# Patient Record
Sex: Female | Born: 1935
Health system: Southern US, Community
[De-identification: ages and names within clinical notes are randomized; demographics above are authoritative.]

## PROBLEM LIST (undated history)

## (undated) DIAGNOSIS — I509 Heart failure, unspecified: Secondary | ICD-10-CM

## (undated) DIAGNOSIS — I2510 Atherosclerotic heart disease of native coronary artery without angina pectoris: Secondary | ICD-10-CM

## (undated) DIAGNOSIS — R0602 Shortness of breath: Secondary | ICD-10-CM

## (undated) DIAGNOSIS — I10 Essential (primary) hypertension: Secondary | ICD-10-CM

## (undated) DIAGNOSIS — M1990 Unspecified osteoarthritis, unspecified site: Secondary | ICD-10-CM

## (undated) DIAGNOSIS — M810 Age-related osteoporosis without current pathological fracture: Secondary | ICD-10-CM

## (undated) DIAGNOSIS — M109 Gout, unspecified: Secondary | ICD-10-CM

## (undated) DIAGNOSIS — E079 Disorder of thyroid, unspecified: Secondary | ICD-10-CM

## (undated) DIAGNOSIS — E039 Hypothyroidism, unspecified: Secondary | ICD-10-CM

## (undated) HISTORY — PX: ABDOMINAL HYSTERECTOMY: SHX81

## (undated) HISTORY — PX: APPENDECTOMY: SHX54

## (undated) HISTORY — PX: CARPAL TUNNEL RELEASE: SHX101

## (undated) HISTORY — PX: TONSILLECTOMY: SUR1361

---

## 1998-01-10 ENCOUNTER — Other Ambulatory Visit: Admission: RE | Admit: 1998-01-10 | Discharge: 1998-01-10 | Payer: Self-pay | Admitting: Internal Medicine

## 1998-07-11 ENCOUNTER — Emergency Department (HOSPITAL_COMMUNITY): Admission: EM | Admit: 1998-07-11 | Discharge: 1998-07-11 | Payer: Self-pay | Admitting: Emergency Medicine

## 1998-07-11 ENCOUNTER — Encounter: Payer: Self-pay | Admitting: Emergency Medicine

## 1999-10-04 ENCOUNTER — Encounter: Payer: Self-pay | Admitting: Emergency Medicine

## 1999-10-04 ENCOUNTER — Emergency Department (HOSPITAL_COMMUNITY): Admission: EM | Admit: 1999-10-04 | Discharge: 1999-10-04 | Payer: Self-pay | Admitting: Emergency Medicine

## 2000-12-07 ENCOUNTER — Encounter: Payer: Self-pay | Admitting: Emergency Medicine

## 2000-12-07 ENCOUNTER — Emergency Department (HOSPITAL_COMMUNITY): Admission: EM | Admit: 2000-12-07 | Discharge: 2000-12-07 | Payer: Self-pay | Admitting: Emergency Medicine

## 2000-12-10 ENCOUNTER — Emergency Department (HOSPITAL_COMMUNITY): Admission: EM | Admit: 2000-12-10 | Discharge: 2000-12-10 | Payer: Self-pay | Admitting: Emergency Medicine

## 2000-12-10 ENCOUNTER — Encounter: Payer: Self-pay | Admitting: Emergency Medicine

## 2001-01-16 ENCOUNTER — Encounter: Payer: Self-pay | Admitting: Internal Medicine

## 2001-01-16 ENCOUNTER — Ambulatory Visit (HOSPITAL_COMMUNITY): Admission: RE | Admit: 2001-01-16 | Discharge: 2001-01-16 | Payer: Self-pay | Admitting: Internal Medicine

## 2001-02-03 ENCOUNTER — Ambulatory Visit (HOSPITAL_COMMUNITY): Admission: RE | Admit: 2001-02-03 | Discharge: 2001-02-03 | Payer: Self-pay | Admitting: Internal Medicine

## 2001-02-03 ENCOUNTER — Encounter: Payer: Self-pay | Admitting: Internal Medicine

## 2001-11-28 ENCOUNTER — Emergency Department (HOSPITAL_COMMUNITY): Admission: EM | Admit: 2001-11-28 | Discharge: 2001-11-28 | Payer: Self-pay | Admitting: *Deleted

## 2002-02-02 ENCOUNTER — Emergency Department (HOSPITAL_COMMUNITY): Admission: EM | Admit: 2002-02-02 | Discharge: 2002-02-02 | Payer: Self-pay | Admitting: Emergency Medicine

## 2002-02-02 ENCOUNTER — Encounter: Payer: Self-pay | Admitting: Emergency Medicine

## 2002-06-06 ENCOUNTER — Encounter: Payer: Self-pay | Admitting: *Deleted

## 2002-06-06 ENCOUNTER — Inpatient Hospital Stay (HOSPITAL_COMMUNITY): Admission: EM | Admit: 2002-06-06 | Discharge: 2002-06-08 | Payer: Self-pay | Admitting: *Deleted

## 2002-06-08 ENCOUNTER — Emergency Department (HOSPITAL_COMMUNITY): Admission: EM | Admit: 2002-06-08 | Discharge: 2002-06-09 | Payer: Self-pay | Admitting: Emergency Medicine

## 2002-06-14 ENCOUNTER — Emergency Department (HOSPITAL_COMMUNITY): Admission: EM | Admit: 2002-06-14 | Discharge: 2002-06-14 | Payer: Self-pay | Admitting: *Deleted

## 2004-01-09 ENCOUNTER — Encounter: Admission: RE | Admit: 2004-01-09 | Discharge: 2004-01-09 | Payer: Self-pay | Admitting: Cardiology

## 2004-02-14 ENCOUNTER — Ambulatory Visit (HOSPITAL_COMMUNITY): Admission: RE | Admit: 2004-02-14 | Discharge: 2004-02-14 | Payer: Self-pay | Admitting: Gastroenterology

## 2004-09-29 ENCOUNTER — Inpatient Hospital Stay (HOSPITAL_COMMUNITY): Admission: EM | Admit: 2004-09-29 | Discharge: 2004-10-02 | Payer: Self-pay | Admitting: *Deleted

## 2004-12-22 ENCOUNTER — Encounter: Admission: RE | Admit: 2004-12-22 | Discharge: 2004-12-22 | Payer: Self-pay | Admitting: Cardiology

## 2005-02-17 ENCOUNTER — Emergency Department (HOSPITAL_COMMUNITY): Admission: EM | Admit: 2005-02-17 | Discharge: 2005-02-17 | Payer: Self-pay | Admitting: *Deleted

## 2005-08-08 ENCOUNTER — Encounter: Admission: RE | Admit: 2005-08-08 | Discharge: 2005-08-08 | Payer: Self-pay | Admitting: Cardiology

## 2006-10-08 ENCOUNTER — Inpatient Hospital Stay (HOSPITAL_COMMUNITY): Admission: EM | Admit: 2006-10-08 | Discharge: 2006-10-10 | Payer: Self-pay | Admitting: Emergency Medicine

## 2006-10-09 ENCOUNTER — Encounter (INDEPENDENT_AMBULATORY_CARE_PROVIDER_SITE_OTHER): Payer: Self-pay | Admitting: Cardiovascular Disease

## 2007-09-22 ENCOUNTER — Ambulatory Visit: Payer: Self-pay | Admitting: Internal Medicine

## 2007-10-02 ENCOUNTER — Ambulatory Visit: Payer: Self-pay | Admitting: Internal Medicine

## 2007-10-29 ENCOUNTER — Emergency Department (HOSPITAL_COMMUNITY): Admission: EM | Admit: 2007-10-29 | Discharge: 2007-10-29 | Payer: Self-pay | Admitting: Emergency Medicine

## 2007-12-09 ENCOUNTER — Ambulatory Visit: Payer: Self-pay | Admitting: Internal Medicine

## 2008-02-06 ENCOUNTER — Emergency Department (HOSPITAL_COMMUNITY): Admission: EM | Admit: 2008-02-06 | Discharge: 2008-02-06 | Payer: Self-pay | Admitting: Emergency Medicine

## 2008-07-13 ENCOUNTER — Emergency Department (HOSPITAL_COMMUNITY): Admission: EM | Admit: 2008-07-13 | Discharge: 2008-07-13 | Payer: Self-pay | Admitting: Emergency Medicine

## 2008-07-16 ENCOUNTER — Emergency Department (HOSPITAL_COMMUNITY): Admission: EM | Admit: 2008-07-16 | Discharge: 2008-07-16 | Payer: Self-pay | Admitting: Emergency Medicine

## 2009-01-31 IMAGING — CR DG CHEST 2V
1 series · 1 of 1 positions shown · non-contrast
Comparison: 10/29/2007

CLINICAL DATA: Chest pain

CHEST - 2 VIEW

[view not recorded]
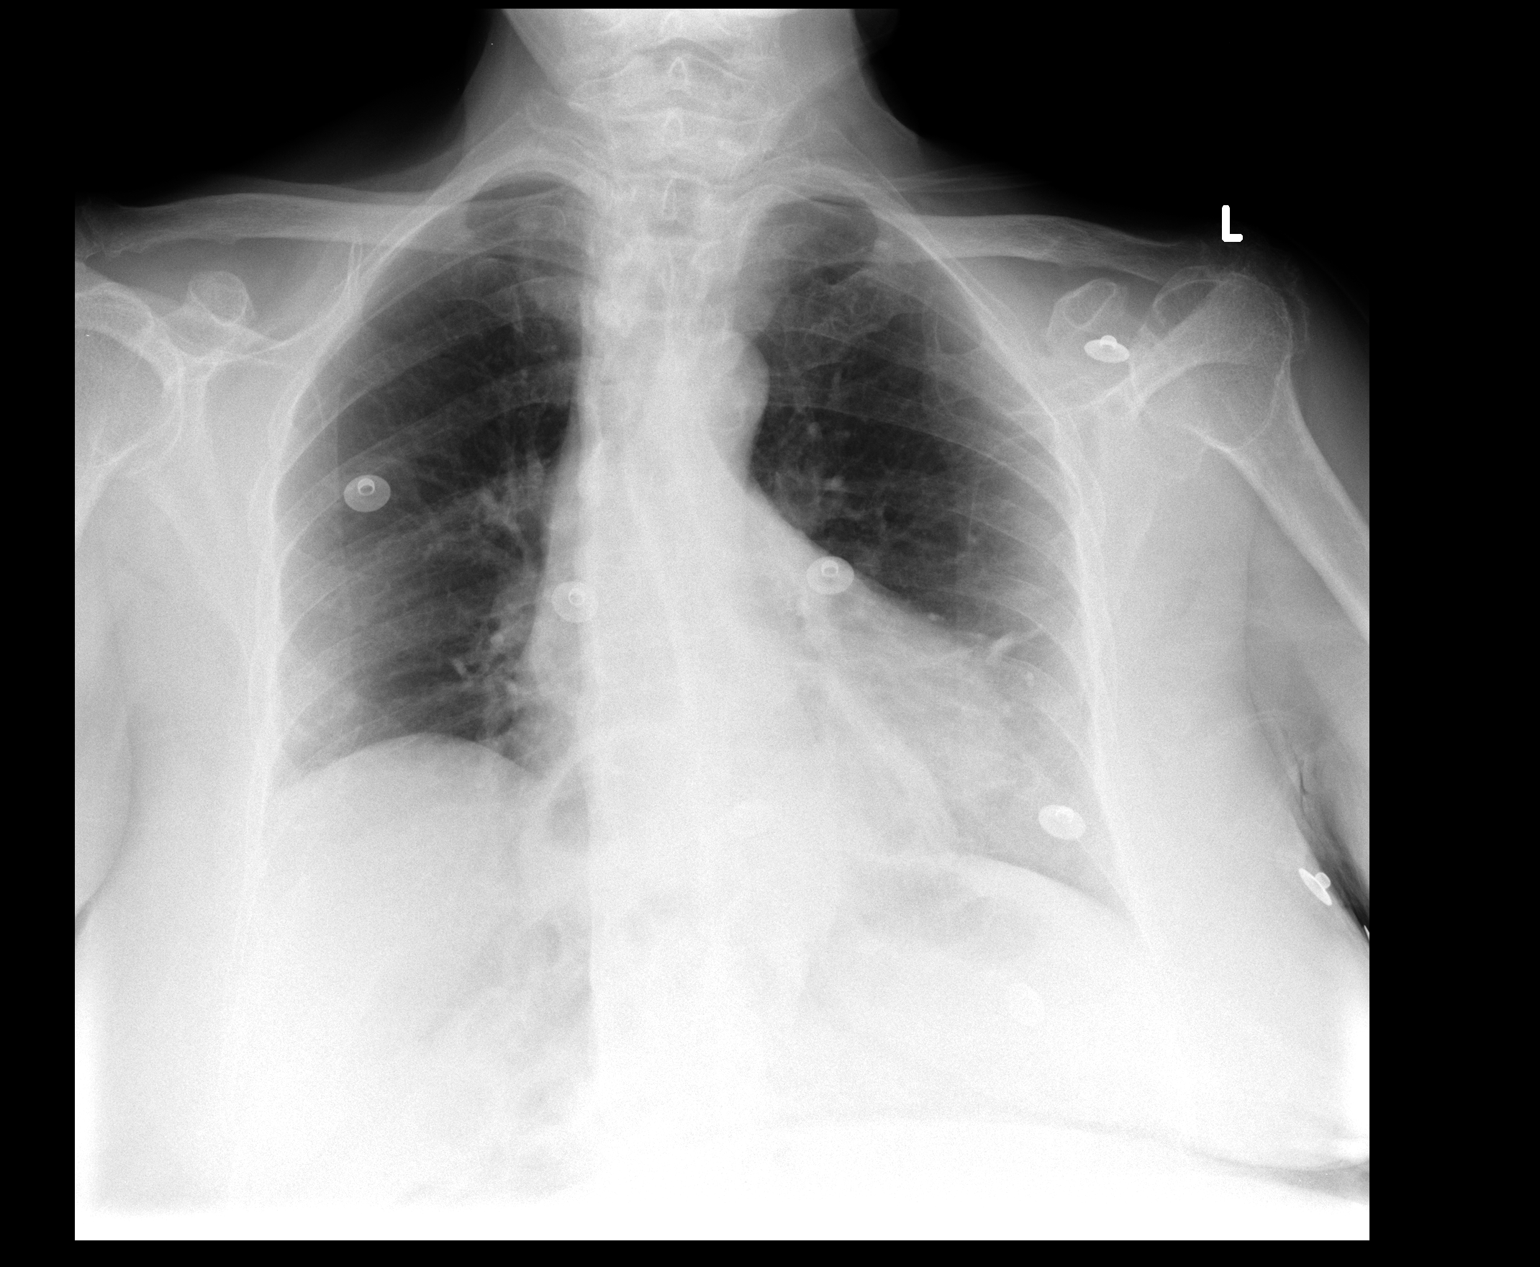

[1 of 1 positions shown; findings below may reference images not displayed]

FINDINGS: Moderate hiatal hernia is evident.  Heart size upper
limits normal.  Lungs are clear.  No effusion.
IMPRESSION: 1.  Moderate hiatal hernia.
2.  No acute disease

## 2009-03-28 ENCOUNTER — Emergency Department (HOSPITAL_COMMUNITY): Admission: EM | Admit: 2009-03-28 | Discharge: 2009-03-28 | Payer: Self-pay | Admitting: Family Medicine

## 2009-10-14 ENCOUNTER — Inpatient Hospital Stay (HOSPITAL_COMMUNITY): Admission: EM | Admit: 2009-10-14 | Discharge: 2009-10-19 | Payer: Self-pay | Admitting: Emergency Medicine

## 2009-11-23 ENCOUNTER — Encounter: Admission: RE | Admit: 2009-11-23 | Discharge: 2009-11-23 | Payer: Self-pay | Admitting: Cardiovascular Disease

## 2010-10-03 ENCOUNTER — Observation Stay (HOSPITAL_COMMUNITY)
Admission: AD | Admit: 2010-10-03 | Discharge: 2010-10-05 | Payer: Self-pay | Source: Home / Self Care | Attending: Cardiovascular Disease | Admitting: Cardiovascular Disease

## 2010-10-03 LAB — COMPREHENSIVE METABOLIC PANEL
ALT: 10 U/L (ref 0–35)
AST: 18 U/L (ref 0–37)
Albumin: 3.4 g/dL — ABNORMAL LOW (ref 3.5–5.2)
Alkaline Phosphatase: 60 U/L (ref 39–117)
BUN: 33 mg/dL — ABNORMAL HIGH (ref 6–23)
CO2: 29 mEq/L (ref 19–32)
Calcium: 9 mg/dL (ref 8.4–10.5)
Chloride: 103 mEq/L (ref 96–112)
Creatinine, Ser: 1.41 mg/dL — ABNORMAL HIGH (ref 0.4–1.2)
GFR calc Af Amer: 42 mL/min — ABNORMAL LOW (ref >60)
GFR calc non Af Amer: 35 mL/min — ABNORMAL LOW (ref >60)
Glucose, Bld: 108 mg/dL — ABNORMAL HIGH (ref 70–99)
Potassium: 5.2 mEq/L — ABNORMAL HIGH (ref 3.5–5.1)
Sodium: 138 mEq/L (ref 135–145)
Total Bilirubin: 0.4 mg/dL (ref 0.3–1.2)
Total Protein: 6.7 g/dL (ref 6.0–8.3)

## 2010-10-03 LAB — DIFFERENTIAL
Basophils Absolute: 0 10*3/uL (ref 0.0–0.1)
Basophils Relative: 0 % (ref 0–1)
Eosinophils Absolute: 0.1 10*3/uL (ref 0.0–0.7)
Eosinophils Relative: 2 % (ref 0–5)
Lymphocytes Relative: 40 % (ref 12–46)
Lymphs Abs: 1.2 10*3/uL (ref 0.7–4.0)
Monocytes Absolute: 0.4 10*3/uL (ref 0.1–1.0)
Monocytes Relative: 13 % — ABNORMAL HIGH (ref 3–12)
Neutro Abs: 1.4 10*3/uL — ABNORMAL LOW (ref 1.7–7.7)
Neutrophils Relative %: 46 % (ref 43–77)

## 2010-10-03 LAB — CBC
HCT: 38.3 % (ref 36.0–46.0)
Hemoglobin: 12.7 g/dL (ref 12.0–15.0)
MCH: 30.8 pg (ref 26.0–34.0)
MCHC: 33.2 g/dL (ref 30.0–36.0)
MCV: 93 fL (ref 78.0–100.0)
Platelets: 214 10*3/uL (ref 150–400)
RBC: 4.12 MIL/uL (ref 3.87–5.11)
RDW: 14.2 % (ref 11.5–15.5)
WBC: 3.1 10*3/uL — ABNORMAL LOW (ref 4.0–10.5)

## 2010-10-03 LAB — CARDIAC PANEL(CRET KIN+CKTOT+MB+TROPI)
CK, MB: 2.3 ng/mL (ref 0.3–4.0)
Relative Index: INVALID (ref 0.0–2.5)
Total CK: 64 U/L (ref 7–177)
Troponin I: 0.02 ng/mL (ref 0.00–0.06)

## 2010-10-04 LAB — URINALYSIS, ROUTINE W REFLEX MICROSCOPIC
Bilirubin Urine: NEGATIVE
Hemoglobin, Urine: NEGATIVE
Ketones, ur: NEGATIVE mg/dL
Nitrite: NEGATIVE
Protein, ur: NEGATIVE mg/dL
Specific Gravity, Urine: 1.008 (ref 1.005–1.030)
Urine Glucose, Fasting: NEGATIVE mg/dL
Urobilinogen, UA: 0.2 mg/dL (ref 0.0–1.0)
pH: 6 (ref 5.0–8.0)

## 2010-10-04 LAB — URINE MICROSCOPIC-ADD ON

## 2010-10-04 LAB — CARDIAC PANEL(CRET KIN+CKTOT+MB+TROPI)
CK, MB: 1.7 ng/mL (ref 0.3–4.0)
CK, MB: 1.8 ng/mL (ref 0.3–4.0)
Relative Index: INVALID (ref 0.0–2.5)
Relative Index: INVALID (ref 0.0–2.5)
Total CK: 48 U/L (ref 7–177)
Total CK: 53 U/L (ref 7–177)
Troponin I: 0.02 ng/mL (ref 0.00–0.06)
Troponin I: 0.02 ng/mL (ref 0.00–0.06)

## 2010-10-04 LAB — TSH: TSH: 2.985 u[IU]/mL (ref 0.350–4.500)

## 2010-10-05 LAB — BASIC METABOLIC PANEL
BUN: 25 mg/dL — ABNORMAL HIGH (ref 6–23)
CO2: 28 mEq/L (ref 19–32)
Calcium: 8.9 mg/dL (ref 8.4–10.5)
Chloride: 102 mEq/L (ref 96–112)
Creatinine, Ser: 1.19 mg/dL (ref 0.4–1.2)
GFR calc Af Amer: 51 mL/min — ABNORMAL LOW (ref >60)
GFR calc non Af Amer: 42 mL/min — ABNORMAL LOW (ref >60)
Glucose, Bld: 84 mg/dL (ref 70–99)
Potassium: 3.9 mEq/L (ref 3.5–5.1)
Sodium: 138 mEq/L (ref 135–145)

## 2010-10-21 ENCOUNTER — Observation Stay (HOSPITAL_COMMUNITY)
Admission: EM | Admit: 2010-10-21 | Discharge: 2010-10-23 | Payer: Self-pay | Source: Home / Self Care | Attending: Cardiovascular Disease | Admitting: Cardiovascular Disease

## 2010-10-23 LAB — DIFFERENTIAL
Basophils Absolute: 0 K/uL (ref 0.0–0.1)
Basophils Relative: 1 % (ref 0–1)
Eosinophils Absolute: 0.1 10*3/uL (ref 0.0–0.7)
Eosinophils Relative: 2 % (ref 0–5)
Lymphocytes Relative: 45 % (ref 12–46)
Lymphs Abs: 1.7 10*3/uL (ref 0.7–4.0)
Monocytes Absolute: 0.4 K/uL (ref 0.1–1.0)
Monocytes Relative: 10 % (ref 3–12)
Neutro Abs: 1.6 10*3/uL — ABNORMAL LOW (ref 1.7–7.7)
Neutrophils Relative %: 43 % (ref 43–77)

## 2010-10-23 LAB — URINALYSIS, ROUTINE W REFLEX MICROSCOPIC
Bilirubin Urine: NEGATIVE
Hgb urine dipstick: NEGATIVE
Ketones, ur: NEGATIVE mg/dL
Nitrite: NEGATIVE
Protein, ur: NEGATIVE mg/dL
Specific Gravity, Urine: 1.021 (ref 1.005–1.030)
Urine Glucose, Fasting: NEGATIVE mg/dL
Urobilinogen, UA: 0.2 mg/dL (ref 0.0–1.0)
pH: 5.5 (ref 5.0–8.0)

## 2010-10-23 LAB — CBC
HCT: 36.1 % (ref 36.0–46.0)
Hemoglobin: 11.9 g/dL — ABNORMAL LOW (ref 12.0–15.0)
MCH: 30.1 pg (ref 26.0–34.0)
MCHC: 33 g/dL (ref 30.0–36.0)
MCV: 91.2 fL (ref 78.0–100.0)
Platelets: 224 10*3/uL (ref 150–400)
RBC: 3.96 MIL/uL (ref 3.87–5.11)
RDW: 13.9 % (ref 11.5–15.5)
WBC: 3.7 10*3/uL — ABNORMAL LOW (ref 4.0–10.5)

## 2010-10-23 LAB — CARDIAC PANEL(CRET KIN+CKTOT+MB+TROPI)
CK, MB: 1.9 ng/mL (ref 0.3–4.0)
Relative Index: INVALID (ref 0.0–2.5)
Relative Index: INVALID (ref 0.0–2.5)
Total CK: 45 U/L (ref 7–177)
Troponin I: 0.01 ng/mL (ref 0.00–0.06)

## 2010-10-23 LAB — CK TOTAL AND CKMB (NOT AT ARMC): Relative Index: INVALID (ref 0.0–2.5)

## 2010-10-23 LAB — POCT CARDIAC MARKERS: Troponin i, poc: <0.05 ng/mL (ref 0.00–0.09)

## 2010-10-23 LAB — BASIC METABOLIC PANEL
BUN: 20 mg/dL (ref 6–23)
CO2: 25 mEq/L (ref 19–32)
Chloride: 103 mEq/L (ref 96–112)
GFR calc Af Amer: 60 mL/min — ABNORMAL LOW (ref >60)
Potassium: 4.4 mEq/L (ref 3.5–5.1)

## 2010-10-23 LAB — BASIC METABOLIC PANEL WITH GFR
Calcium: 9.3 mg/dL (ref 8.4–10.5)
Creatinine, Ser: 1.04 mg/dL (ref 0.4–1.2)
GFR calc non Af Amer: 50 mL/min — ABNORMAL LOW (ref >60)
Glucose, Bld: 87 mg/dL (ref 70–99)
Sodium: 137 meq/L (ref 135–145)

## 2010-10-23 LAB — PROTIME-INR
INR: 0.99 (ref 0.00–1.49)
Prothrombin Time: 13.3 seconds (ref 11.6–15.2)

## 2010-10-23 LAB — APTT: aPTT: 33 s (ref 24–37)

## 2010-10-23 LAB — BRAIN NATRIURETIC PEPTIDE: Pro B Natriuretic peptide (BNP): 88 pg/mL (ref 0.0–100.0)

## 2010-10-23 LAB — D-DIMER, QUANTITATIVE: D-Dimer, Quant: 1.05 ug/mL-FEU — ABNORMAL HIGH (ref 0.00–0.48)

## 2010-10-26 NOTE — Discharge Summary (Signed)
  NAMEMEGHEN, AKOPYAN              ACCOUNT NO.:  [id number]  MEDICAL RECORD NO.:  [id number]          PATIENT TYPE:  OBV  LOCATION:  3731                         FACILITY:  [facility]  PHYSICIAN:  Ricki Rodriguez, M.D.  DATE OF BIRTH:  Jul 25, 1916  DATE OF ADMISSION:  10/21/2010 DATE OF DISCHARGE:  10/23/2010                              DISCHARGE SUMMARY   The patient was admitted by Dr. Orpah Cobb.  FINAL DIAGNOSES: 1. Chest pain. 2. Hypertension. 3. Gout. 4. Hypothyroidism. 5. Large diaphragmatic hernia. 6. Osteoarthrosis.  DISCHARGE MEDICATIONS: 1. Rosuvastatin 5 mg half a tablet at bedtime. 2. Azor 5/20 mg half a tablet daily. 3. Aspirin 81 mg daily. 4. Fish oil 1000 mg daily. 5. Levoxyl 50 mcg one daily. 6. Nitroglycerin 0.4 mg once sublingual every 5 minutes as needed up     to three doses as needed. 7. Soriatane 25 mg one tablet once a week. 8. Triamcinolone 0.1%, Cetaphil lotion, one application topically     twice daily.  DISCHARGE DIET:  Low-sodium heart-healthy diet.  DISCHARGE ACTIVITY:  The patient is to increase activity slowly as tolerated. Followup by Dr. Orpah Cobb in 2 weeks.  The patient is to call [phone] for appointment.  HISTORY:  This is a 77 year old black female presented with substernal chest pain.  The patient has some episodes of palpitation also and because of her advanced age, the patient is treated medically only.  PHYSICAL EXAMINATION:  GENERAL:  The patient is alert and oriented x3, in no acute distress. VITAL SIGNS:  Temperature 97.3, pulse 69, respirations 18, blood pressure 110/56. HEENT:  The patient is normocephalic, atraumatic with brown eyes. Conjunctivae pink.  Sclerae nonicteric.  Throat revealed wet midline pink tongue. NECK:  No JVD and no carotid bruit. LUNGS:  Clear bilaterally. HEART:  Normal S1 and S2. ABDOMEN:  Soft and nontender. EXTREMITIES:  Trace ankle edema.  No cyanosis or clubbing. CENTRAL NERVOUS  SYSTEM:  Cranial nerves grossly intact.  Bilateral equal grips.  The patient is right handed. SKIN:  Warm and dry.  LABORATORY DATA:  Hemoglobin slightly low at 11.9, hematocrit 36.1, normal WBC count and platelet count.  Normal electrolytes, BUN, creatinine.  INR 0.99.  B natriuretic peptide normal at 88.  Troponin I and CK-MB normal x3.  Chest x-ray, no acute finding, moderate hiatal hernia. CT angiography of the chest did not reveal any acute pulmonary embolus and showed large hiatal hernia again, right hepatic cyst and left renal cyst.  HOSPITAL COURSE:  The patient was placed in observation, myocardial infarction was ruled out.  Her activity was increased, and on October 23, 2010, she was discharged home in satisfactory condition with followup by me in 2 weeks.     Ricki Rodriguez, M.D.     ASK/MEDQ  D:  10/23/2010  T:  10/24/2010  Job:  [id number]  Electronically Signed by Orpah Cobb M.D. on 10/24/2010 01:21:49 PM

## 2010-10-26 NOTE — H&P (Signed)
  NAMEYESLI, [name]              ACCOUNT NO.:  [id number]  MEDICAL RECORD NO.:  [id number]          PATIENT TYPE:  OBV  LOCATION:  3731                         FACILITY:  [facility]  PHYSICIAN:  Ricki Rodriguez, M.D.  DATE OF BIRTH:  [birth year]  DATE OF ADMISSION:  10/21/2010 DATE OF DISCHARGE:                             HISTORY & PHYSICAL   CHIEF COMPLAINT:  Chest pain.  HISTORY OF PRESENT ILLNESS:  This 77 year old black female with a chronic history of weakness and palpitation, had sudden onset of chest pain responding to nitroglycerin and oxygen.  Currently, the patient is chest pain free.  PAST MEDICAL HISTORY:  Positive for diabetes mellitus type 2 and hypertension for a long time and no history of smoking, alcohol intake, drug intake, or elevated cholesterol level.  PAST SURGICAL HISTORY:  Hysterectomy in 1940s, cataract surgery of the left side many years ago.  PERSONAL HISTORY:  The patient is widowed husband died in 03/09/62 from cerebral hemorrhage aged 76.  The patient has one daughter.  FAMILY HISTORY:  Mother died age 63 with some kind of cancer.  Father died of heart disease at age 90.  No brother, has 4 sisters who are all living.  REVIEW OF SYSTEMS:  The patient denies recent weight gain, weight loss, wears glasses, dentures.  No history of cough, no history of sinus drainage.  No headache or hemoptysis.  Occasional chest pain, palpitations, and dyspnea.  No history of asthma.  No GI bleed, hepatitis, blood transfusion.  Positive history of kidney stone.  No strokes, seizures, or psychiatric admissions.  PHYSICAL EXAMINATION:  CNS:  The patient is averagely built and nourished black female in no acute distress. HEENT:  The patient is normocephalic, atraumatic with brown eyes. Conjunctivae pink.  Sclerae nonicteric, throat pink.  Wet midline tongue. NECK:  No JVD, no carotid bruit. LUNGS:  Clear bilaterally. HEART:  Normal S1 and S2. ABDOMEN:  Soft and  nontender. EXTREMITIES:  Trace edema, no cyanosis, or clubbing. CNS:  Cranial nerves grossly intact.  Bilateral equal grips.  The patient is right handed. SKIN:  Warm and dry.  MEDICATIONS: 1. Azor 5/20 half a tablet daily 2. Colcrys 0.6 mg daily. 3. Crestor 5 mg half a tablet at bedtime. 4. Metoprolol tartrate 25 mg half twice daily. 5. Levoxyl 50 mcg daily. 6. Soriatane 25 mg every other day. 7. Aspirin 81 mg daily. 8. Lasix 20 mg once a week as needed. 9. Potassium 10 mEq one daily as needed. 10.Calcium 69 mg daily. 11.Nitroglycerin 0.4 mg one sublingual every 5 minutes x3 as needed     for chest pain.  ALLERGIES:  None.  IMPRESSION: 1. Chest pain. 2. Palpitation. 3. Weakness.  PLAN:  To place the patient in observation and rule out for MI and continue home medications.     Ricki Rodriguez, M.D.     ASK/MEDQ  D:  10/21/2010  T:  10/22/2010  Job:  [id number]  Electronically Signed by Orpah Cobb M.D. on 10/24/2010 01:20:52 PM

## 2010-12-16 LAB — COMPREHENSIVE METABOLIC PANEL
ALT: <8 U/L (ref 0–35)
AST: 20 U/L (ref 0–37)
Albumin: 3.5 g/dL (ref 3.5–5.2)
Alkaline Phosphatase: 79 U/L (ref 39–117)
Calcium: 8.9 mg/dL (ref 8.4–10.5)
GFR calc Af Amer: 53 mL/min — ABNORMAL LOW (ref >60)
Glucose, Bld: 131 mg/dL — ABNORMAL HIGH (ref 70–99)
Potassium: 3.8 mEq/L (ref 3.5–5.1)
Sodium: 139 mEq/L (ref 135–145)
Total Protein: 6.5 g/dL (ref 6.0–8.3)

## 2010-12-16 LAB — BASIC METABOLIC PANEL
BUN: 20 mg/dL (ref 6–23)
CO2: 29 mEq/L (ref 19–32)
Calcium: 8.9 mg/dL (ref 8.4–10.5)
Creatinine, Ser: 1.03 mg/dL (ref 0.4–1.2)
Glucose, Bld: 81 mg/dL (ref 70–99)

## 2010-12-16 LAB — DIFFERENTIAL
Basophils Absolute: 0 10*3/uL (ref 0.0–0.1)
Basophils Relative: 1 % (ref 0–1)
Eosinophils Absolute: 0.1 10*3/uL (ref 0.0–0.7)
Eosinophils Relative: 3 % (ref 0–5)
Monocytes Absolute: 0.2 10*3/uL (ref 0.1–1.0)
Monocytes Relative: 7 % (ref 3–12)
Neutro Abs: 1.4 10*3/uL — ABNORMAL LOW (ref 1.7–7.7)

## 2010-12-16 LAB — CBC
Hemoglobin: 12.5 g/dL (ref 12.0–15.0)
MCHC: 34.7 g/dL (ref 30.0–36.0)
Platelets: 179 10*3/uL (ref 150–400)
RDW: 14.4 % (ref 11.5–15.5)

## 2010-12-16 LAB — CARDIAC PANEL(CRET KIN+CKTOT+MB+TROPI)
CK, MB: 1.8 ng/mL (ref 0.3–4.0)
CK, MB: 2.3 ng/mL (ref 0.3–4.0)
Relative Index: INVALID (ref 0.0–2.5)
Relative Index: INVALID (ref 0.0–2.5)
Total CK: 53 U/L (ref 7–177)
Troponin I: 0.01 ng/mL (ref 0.00–0.06)

## 2010-12-16 LAB — TSH: TSH: 4.259 u[IU]/mL (ref 0.350–4.500)

## 2010-12-16 LAB — PROTIME-INR: Prothrombin Time: 13.4 seconds (ref 11.6–15.2)

## 2010-12-17 LAB — LIPID PANEL
HDL: 75 mg/dL (ref >39)
Total CHOL/HDL Ratio: 3.3 RATIO

## 2010-12-17 LAB — CBC
HCT: 33 % — ABNORMAL LOW (ref 36.0–46.0)
MCHC: 34.6 g/dL (ref 30.0–36.0)
MCV: 92.1 fL (ref 78.0–100.0)
RBC: 3.58 MIL/uL — ABNORMAL LOW (ref 3.87–5.11)
WBC: 3.3 10*3/uL — ABNORMAL LOW (ref 4.0–10.5)

## 2010-12-17 LAB — BASIC METABOLIC PANEL
BUN: 16 mg/dL (ref 6–23)
Calcium: 8.5 mg/dL (ref 8.4–10.5)
Chloride: 104 mEq/L (ref 96–112)
Creatinine, Ser: 1.09 mg/dL (ref 0.4–1.2)

## 2010-12-17 LAB — CARDIAC PANEL(CRET KIN+CKTOT+MB+TROPI): Troponin I: 0.02 ng/mL (ref 0.00–0.06)

## 2011-01-21 ENCOUNTER — Emergency Department (HOSPITAL_COMMUNITY): Payer: Medicare Other

## 2011-01-21 ENCOUNTER — Emergency Department (HOSPITAL_COMMUNITY)
Admission: EM | Admit: 2011-01-21 | Discharge: 2011-01-21 | Disposition: A | Discharge status: Home / Self Care | Payer: Medicare Other | Attending: Emergency Medicine | Admitting: Emergency Medicine

## 2011-01-21 DIAGNOSIS — K449 Diaphragmatic hernia without obstruction or gangrene: Secondary | ICD-10-CM | POA: Insufficient documentation

## 2011-01-21 DIAGNOSIS — R0789 Other chest pain: Secondary | ICD-10-CM | POA: Insufficient documentation

## 2011-01-21 DIAGNOSIS — E039 Hypothyroidism, unspecified: Secondary | ICD-10-CM | POA: Insufficient documentation

## 2011-01-21 DIAGNOSIS — I10 Essential (primary) hypertension: Secondary | ICD-10-CM | POA: Insufficient documentation

## 2011-01-21 DIAGNOSIS — I517 Cardiomegaly: Secondary | ICD-10-CM | POA: Insufficient documentation

## 2011-01-21 LAB — CBC
HCT: 38.9 % (ref 36.0–46.0)
MCHC: 34.2 g/dL (ref 30.0–36.0)
RDW: 13.1 % (ref 11.5–15.5)

## 2011-01-21 LAB — POCT CARDIAC MARKERS
CKMB, poc: <1 ng/mL — ABNORMAL LOW (ref 1.0–8.0)
Myoglobin, poc: 111 ng/mL (ref 12–200)

## 2011-01-21 LAB — BASIC METABOLIC PANEL
Calcium: 9.4 mg/dL (ref 8.4–10.5)
GFR calc Af Amer: 59 mL/min — ABNORMAL LOW (ref >60)
GFR calc non Af Amer: 48 mL/min — ABNORMAL LOW (ref >60)
Glucose, Bld: 85 mg/dL (ref 70–99)
Sodium: 139 mEq/L (ref 135–145)

## 2011-01-21 LAB — DIFFERENTIAL
Basophils Absolute: 0 10*3/uL (ref 0.0–0.1)
Basophils Relative: 1 % (ref 0–1)
Eosinophils Absolute: 0.1 10*3/uL (ref 0.0–0.7)
Eosinophils Relative: 2 % (ref 0–5)
Monocytes Absolute: 0.3 10*3/uL (ref 0.1–1.0)

## 2011-01-21 LAB — POCT I-STAT, CHEM 8
HCT: 41 % (ref 36.0–46.0)
Hemoglobin: 13.9 g/dL (ref 12.0–15.0)
Potassium: 4.7 mEq/L (ref 3.5–5.1)
Sodium: 138 mEq/L (ref 135–145)

## 2011-02-12 NOTE — Assessment & Plan Note (Signed)
[facility]                         ELECTROPHYSIOLOGY OFFICE NOTE   [name], [name]                     MRN:          [id number]  DATE:09/22/2007                            DOB:          [birth year]    HISTORY OF PRESENT ILLNESS:  [name] returns to have her follow up.  She was a very pleasant 77 year old woman with a history of dizzy spells  and near syncopal episodes who is referred today by Dr. Shana [name] for  additional evaluation.  History is that for the last several weeks, she  has had episodes that wake her up from sleep.  Having been awakened, she  will often try to go to the bathroom and when she does though will end  up getting dizzy and lightheaded.  She has had no frank syncope however.  She denies chest pain.  She has peripheral edema which is variable.   MEDICATIONS:  Include:  1. Levoxyl 15 mcg daily.  2. Tekturna 150/12.5 daily.  3. Metoprolol 25 mg half tablet twice daily.  4. Azor 5/40 daily.  5. She is also on low-dose aspirin.   FAMILY HISTORY:  Noncontributory at her very advanced age.   SOCIAL HISTORY:  The patient denies tobacco or ethanol abuse.  She is  widowed.  She has been retired for many years.   REVIEW OF SYSTEMS:  Notable for arthritis in multiple joints  bilaterally, otherwise unremarkable except as noted in the HPI.   PHYSICAL EXAMINATION:  GENERAL:  She is a pleasant, elderly-appearing  woman in no acute distress.  VITAL SIGNS:  Blood pressure was 146/76, the pulse 55 and regular, the  respirations were 18, the weight was 177 pounds.  HEENT:  Normocephalic and atraumatic.  Pupils were equal and round.  Oropharynx moist, sclerae anicteric.  NECK:  Revealed no jugular venous distention.  There is no thyromegaly.  The trachea is midline.  The carotids are 2+ and symmetric.  LUNGS:  Clear bilaterally on auscultation.  No wheezes, rales or rhonchi  were present.  There is no increased  work of  breathing.  CARDIOVASCULAR:  Regular rate and rhythm, normal S1 and S2.  There are  no murmurs, rubs or gallops. The PMI was not enlarged nor laterally  displaced.  ABDOMEN:  Soft, nontender, nondistended, there is no organomegaly.  The  bowel sounds are present.  No rebound, no guarding.  EXTREMITIES:  Demonstrate no cyanosis, clubbing or edema.  The pulses  were 2+ and symmetric.  NEUROLOGIC:  Alert and oriented times 3 with cranial nerves intact.  Strength was 5/5 and symmetric.   EKG:  Demonstrates sinus bradycardia with sinus arrhythmia.   IMPRESSION:  1. Recurrent episodes of palpitations and dizziness associated with      near syncope without frank syncope.  2. Remote history of chest pain, now resolved.   DISCUSSION:  [name] has symptoms that are unclear as to their  etiology.  She may well be having atrial fibrillation, or she may be  having supraventricular tachycardia __________.  I have recommended that  she wear a cardiac monitor and see me back  in the office in six weeks.     Ashlee Canning. Ashlee Ridgel, MD  Electronically Signed    GWT/MedQ  DD: 09/22/2007  DT: 09/22/2007  Job #: [id number]   cc:   [name], M.D.

## 2011-02-12 NOTE — Assessment & Plan Note (Signed)
[facility]                         ELECTROPHYSIOLOGY OFFICE NOTE   [name], [name]                     MRN:          [id number]  DATE:12/09/2007                            DOB:          06/02/1916    Ms. [name] returns today for follow up.  She is a very pleasant 77-year-  old patient of Dr. Magda Kiel who has a history of palpitations.  I saw  her back in December and at that time recommended that she wear a  cardiac monitor because of her palpitations to try to better understand  what the mechanism of her symptoms was.  She also noted that she had  dizzy spells at that time.  The patient subsequently wore her monitor  which demonstrated no profound tachycardia or bradycardia.  She had  sinus rhythm at rates up to 90 beats per minute, but also had rare  episodes of AV Wenckebach with pauses up to 2 seconds.  She returns  today for follow up.  She states that in the last 2 months she has been  fairly stable.  She has had no frank syncope.   MEDICATIONS:  1. Tekturna/HCTZ  180/25.  2. Metoprolol 25 mg half tablet twice daily.  3. Aspirin 81 mg daily.  4. Soriatane.  5. Multivitamins.   PHYSICAL EXAMINATION:  GENERAL:  She is a pleasant, well-appearing  elderly woman who looks younger than her stated age.  VITAL SIGNS:  Blood pressure was 140/70, the pulse was 68 and regular,  respirations were 18.  The weight was 175 pounds.  NECK:  7 cm jugular venous distention.  LUNGS:  Clear bilaterally to auscultation.  No wheezes, rales or rhonchi  are present.  There is no increased work of breathing.  CARDIOVASCULAR:  Regular rate and rhythm.  Normal S1 and S2.  There were  no murmurs, rubs or gallops that I could appreciate.  EXTREMITIES:  1+ peripheral edema bilaterally.  There was no cyanosis or  clubbing noted.   IMPRESSION:  1. Palpitations.  2. Dizziness, etiology unknown.   DISCUSSION:  Ms. Cantrelle is overall stable right now.  The  mechanism of  her symptoms is still unclear, but she is overall improved.  I have  recommended a period of watchful waiting.  I have  asked that if she has more symptoms that she come in and get an EKG in  our office.  Will see her back on a p.r.n. basis.     Doylene Canning. Ladona Ridgel, MD  Electronically Signed    GWT/MedQ  DD: 12/09/2007  DT: 12/10/2007  Job #: [id number]   cc:   Osvaldo Shipper. Spruill, M.D.

## 2011-02-12 NOTE — Letter (Signed)
September 22, 2007    Osvaldo Shipper. Spruill, M.D.  [address] [address]  [place], [place] [address]   RE:  KETURAH, YERBY  MRN:  [id number]  /  DOB:  05-Apr-1916   Dear Kevin Fenton:   Thank you for referring Naysha Sholl for EP evaluation of recurrent  episodes of near syncope associated with palpitations.  As you know, she  is a pleasant 77 year old woman whose health has been quite good in the  past, who, over the last several months, has had every 3 to 4 weeks  episodes of palpitations which often awaken her from sleep at night.  When she gets up to go to the bathroom, she becomes dizzy and  lightheaded, although she has not had any frank syncope.  She has sought  medical attention on several occasions, but the etiology of her symptoms  has never been discovered.  It sounds like by the time she gets to the  hospital, or the paramedics are able to check her rhythm out, she has  gone back to sinus rhythm.  The patient, otherwise, has been stable.  She has had no frank syncope.   Her examination is notable that she is 77 years old, but otherwise  appears well.   Her EKG demonstrates sinus bradycardia.   I have discussed the treatment options with Ms. Wittmeyer in detail.  I have  recommended that she undergo the wearing of a 30-day cardiac monitor,  and depending on what this shows, she might ultimately require an  implantable loop recorder.  My guess is that she is going in and out of  atrial  fibrillation as a mechanism of her palpitations and her near syncopal  episodes.  If we could document this, then additional therapy would be  recommended for Ms. Cocking.   I plan to see her back after she has worn her monitor in 6 to 8 weeks.  Thanks again for referring her for EP evaluation.    Sincerely,      Doylene Canning. Ladona Ridgel, MD  Electronically Signed    GWT/MedQ  DD: 09/22/2007  DT: 09/22/2007  Job #: [id number]

## 2011-02-15 NOTE — Discharge Summary (Signed)
[name], [name]              ACCOUNT NO.:  [id number]   MEDICAL RECORD NO.:  [id number]          PATIENT TYPE:  INP   LOCATION:  4703                         FACILITY:  [facility]   PHYSICIAN:  Ricki Rodriguez, M.D.  DATE OF BIRTH:  22-Feb-1916   DATE OF ADMISSION:  10/08/2006  DATE OF DISCHARGE:  10/10/2006                               DISCHARGE SUMMARY   PRINCIPAL DIAGNOSES:  1. Chest pain.  2. Hypertension.  3. Hypothyroidism.  4. Possible iron deficiency anemia.  5. Reflux esophagitis.  6. Hyperlipidemia.   DISCHARGE DIET:  Low fat, low-salt diet as tolerated.   DISCHARGE ACTIVITIES:  Patient to increase activity slowly.   FOLLOWUP:  By Dr. Orpah Cobb, patient to call [phone] for  appointment.   CONDITION ON DISCHARGE:  Improved.   HISTORY:  This is an 77 year old black female complaining of left-sided  chest pain going down the left arm and left leg off-and-on for two days.  Patient admitted to have shortness of breath and sweating spell also.  She was chest pain free with IV nitroglycerin use in the emergency room  and chose medical therapy because of advanced age.   PHYSICAL EXAMINATION:  VITAL SIGNS:  Temperature 96.9, pulse 59,  respirations 18, blood pressure 167/74, she is 5'5 tall and weighs  approximately 160 pounds.  HEENT:  Patient is normocephalic, atraumatic with brown eyes, pupils  equally reactive to light, has bilateral lens haziness.  NECK:  No JVD.  LUNGS:  Clear bilaterally.  HEART:  Normal S1-S2.  ABDOMEN:  Soft.  EXTREMITIES:  Without edema, cyanosis or clubbing, positive few varicose  veins.  NEUROLOGICALLY:  Cranial nerves grossly intact.  Patient moves all four  extremities.   LABORATORY DATA:  Reveal a borderline hemoglobin of 11.9, hematocrit 34,  slightly low WBC count of 3700, normal platelet count, normal  electrolytes, BUN borderline at 24, creatinine borderline at 1.4,  myoglobin 128 then elevated at 368 then back down to 113,  troponin I was  less than 0.05 x3.   Patient's EKG showed sinus bradycardia with sinus arrhythmia, otherwise  unremarkable.   A 2-D echocardiogram showed mild LVH with mild aortic valve sclerosis  without stenosis and trace-to-mild IA, trace tricuspid regurgitation and  trace mitral regurgitation.   HOSPITAL COURSE:  Patient was admitted to telemetry unit.  She did not  have myocardial infarction from her cardiac enzyme level, however she  was given choice to go for a stress test or cardiac catheterization or  continue medical therapy.  Because of her advanced age, patient chose  medical therapy along with her do not resuscitate status.  She was  treated medically with addition of Prilosec, Lipitor and continuing home  medications.  Within 24 hours patient was feeling better and within 48  hours she was discharged home in satisfactory condition.  Her hemoglobin  had dropped from IV heparin use; hence, serum iron level will be  checked.  She was placed on ferrous sulfate 325 mg one daily and she  will have outpatient followup by me in two weeks.      [name], M.D.  Electronically Signed     ASK/MEDQ  D:  10/10/2006  T:  10/10/2006  Job:  [id number]

## 2011-02-15 NOTE — Discharge Summary (Signed)
NAMEJANELLI, [name]              ACCOUNT NO.:  [id number]   MEDICAL RECORD NO.:  [id number]          PATIENT TYPE:  INP   LOCATION:  3704                         FACILITY:  [facility]   PHYSICIAN:  Ricki Rodriguez, M.D.  DATE OF BIRTH:  1916/08/13   DATE OF ADMISSION:  09/29/2004  DATE OF DISCHARGE:  10/02/2004                                 DISCHARGE SUMMARY   REFERRING PHYSICIAN:  Osvaldo Shipper. Spruill, M.D.   DISCHARGE DIAGNOSES:  1.  Chest pain.  2.  Hypertension.  3.  Coronary artery disease.  4.  Arthritis.   DISCHARGE MEDICATIONS:  1.  Altace 5 mg one daily.  2.  Toprol XL 25 mg one daily.  3.  Darvocet-N 50 one twice daily.  4.  Caduet 10/20 one in the evening.  5.  Calcium 600 plus vitamin D one daily.   The patient is to discontinue Demadex and Celebrex.   PAIN MANAGEMENT:  The patient is to use Darvocet as directed.   ACTIVITY:  As tolerated.  The patient is to use the cane to walk.   DIET:  Low fat, low salt diet as tolerated.   FOLLOW UP:  With Dr. Shana Chute in two weeks.  The patient is to call [phone]  for appointment.   HISTORY OF PRESENT ILLNESS:  This 77 year old black female presented with  chest pain and shortness of breath for two days, without any sweating spell,  coughing spell, or fever.  The chest pain was nonradiating.  The patient had  past history of angioplasty four to five years ago and a cardiac  catheterization in 2003 which was unremarkable.  She has history of  hypertension for 50 years and history of elevated cholesterol level and a  history of congestive heart failure.   PHYSICAL EXAMINATION:  VITAL SIGNS:  Pulse 71, respirations 18, blood  pressure 174/79, height 5 feet 5 inches, weight 176 pounds.  HEENT:  The patient is normocephalic and atraumatic with gray hair.  Wears  glasses with brown eyes.  Positive bilateral cataracts.  Negative upper and  lower jaw molars, but does not wear dentures.  NECK:  Left lower neck surgical scar,  otherwise unremarkable.  LUNGS:  Clear bilaterally.  HEART:  Normal S1 and S2.  ABDOMEN:  Soft and nontender.  EXTREMITIES:  No cyanosis or clubbing.  There was 1+ edema.  NEUROLOGY:  Grossly intact.   LABORATORY DATA:  Normal hemoglobin and hematocrit.  Low WBC count, normal  platelet count, normal PT, normal electrolytes, BUN, and creatinine.  Normal  glucose.  Normal lipase.  Borderline amylase level.   EKG; normal sinus rhythm.   Chest x-ray; no acute process, positive hiatal hernia.   Nuclear stress test without any reversible ischemia.  Ejection fraction was  80%.   CK-MB and troponin I negative x3.   HOSPITAL COURSE:  The patient was admitted to the telemetry unit.  Myocardial infarction was ruled out.  The patient had no additional chest  pain.  Her medications were adjusted and she was discharged home in  satisfactory condition with follow-up by primary care/cardiologist, Dr.  Spruill in two weeks.      Ajay   ASK/MEDQ  D:  10/02/2004  T:  10/02/2004  Job:  [id number]

## 2011-02-15 NOTE — Op Note (Signed)
NAME:  [name], [name]                        ACCOUNT NO.:  [id number]   MEDICAL RECORD NO.:  [id number]                   PATIENT TYPE:  AMB   LOCATION:  ENDO                                 FACILITY:  [facility]   PHYSICIAN:  John C. Madilyn Fireman, M.D.                 DATE OF BIRTH:  November 25, 1915   DATE OF PROCEDURE:  02/14/2004  DATE OF DISCHARGE:                                 OPERATIVE REPORT   PROCEDURE:  Esophagogastroduodenoscopy with esophageal dilatation.   INDICATIONS FOR PROCEDURE:  Solid food dysphagia with suggestion of an  esophageal ring or stricture.   PROCEDURE:  The patient was placed in the left lateral decubitus position  and placed on the pulse monitor with continuous low flow oxygen delivered by  nasal cannula.  She was sedated with 50 mcg IV fentanyl and 5 mg IV Versed.  The Olympus video endoscope was advanced under direct vision into the  oropharynx and esophagus.  The esophagus was somewhat tortuous but of normal  caliber with the squamocolumnar line at 33 cm.  There appeared to be some  short-segment Barrett's esophagus of about 3 cm in length above a fairly  patent lower esophageal ring.  There were no visible erosions or ulcers and  no significant length of stricture at the level of the ring.  Beyond the  ring, there was a very large hiatal hernia estimated to be 6-7 cm in length.  The stomach was entered, and the hernia sac was inspected and appeared to be  free of any ulcerations.  The infra-abdominal stomach was then entered and  retroflexed view of the cardia confirmed a large hiatal hernia.  The fundus,  body, antrum, and pylorus all appeared normal.  The duodenum was entered.  Both bulb and second portions were well inspected and appeared to be within  normal limits.  The Savary guidewire was placed through the endoscope  channel, and the scope withdrawn.  A 17 mm Savary dilator was passed over  the guidewire with mild resistance, and no blood seen on  withdrawal.  The  dilators were removed together with the wire, and the patient returned to  the recovery room in stable condition.  She tolerated the procedure well.  There were no immediate complications.   IMPRESSION:  Large hiatal hernia with patent esophageal ring, probably short-  segment Barrett's esophagus.  Status post empiric dilatation.   PLAN:  Advance diet.  Observe for response to dilatation.  I do not believe  she would benefit from any endoscopic screening of her short-segment  Barrett's esophagus at her age.                                               John C. Madilyn Fireman, M.D.    JCH/MEDQ  D:  02/14/2004  T:  02/14/2004  Job:  [id number]   cc:   Osvaldo Shipper. Spruill, M.D.  [address] [address]  [place]  [place] [address]  Fax: [phone]

## 2011-02-15 NOTE — Discharge Summary (Signed)
NAME:  [name], [name]                        ACCOUNT NO.:  [id number]   MEDICAL RECORD NO.:  [id number]                   PATIENT TYPE:  INP   LOCATION:  3711                                 FACILITY:  [facility]   PHYSICIAN:  Runell Gess, M.D.             DATE OF BIRTH:  29-Sep-1916   DATE OF ADMISSION:  06/06/2002  DATE OF DISCHARGE:  06/08/2002                                 DISCHARGE SUMMARY   HISTORY OF PRESENT ILLNESS:  The patient is an 77 year old, African-American  lady who presented to the emergency room on 06/06/2002, with complaints of  shortness of breath and chest pain radiating to the left arm.  She took  Nitroglycerin and pain was nonresponsive and she was delivered to the  emergency room at [facility].  At the time of admission, she was  feeling better, but her blood pressure was elevated 170/80 and pulse was  lower 60's.   PAST MEDICAL HISTORY:  Significant for hypertensive disease and chronic  renal failure, otherwise no significant past medical history.   EKG revealed no specific ST or T wave changes and normal sinus rhythm.   HOSPITAL COURSE:  The patient was admitted to the telemetry unit on rule out  myocardial infarction with diagnosis of unstable angina pectoris.   The next day. she underwent coronary angiography performed by Dr. Elsie Lincoln.  Cath revealed normal coronary arteries of large size and free of significant  disease.  Her left ventricular ejection fraction was normal with 65% to 70%.  IV Nitroglycerin was stopped and Heparin was stopped.  The patient was  stable and free of pain.   The next morning, she remained pain free, but her blood pressure was  suboptimally controlled with systolic _______________.  Her hemoglobin was  12.1, hematocrit 35.7, white blood cell count 3.6, and platelets 200.   Her creatinine on admission was 1.4 and BUN 18, and at the time of discharge  the BUN and creatinine were pending.  The patient, as I  mentioned remained  free of pain.  Her groin site was stable.  There was no bleeding or oozing.  It was localized on the upper side, small quarter-size superficial hematoma,  but no bruits were heard and the patient did not complain of any pain or  discomfort and pedal pulses, dorsalis pedis, and posterior tibialis were  palpable.  The patient was discharged home in stable condition.   DISCHARGE MEDICATIONS:  Altace 5 mg q.d., Norvasc 5 mg q.d., Aspirin 325 mg  q.d. and Nitroglycerin 0.4 mg sublingual p.r.n.   DISCHARGE DIAGNOSES:  1. Stable angina pectoris on admission status-post cath was clear, coronary     ejection fraction 65% to 70%.  Pain has resolved.  Etiology of pain     unclear, could be related to elevated blood pressure or some complaint of     gastroesophageal reflux disease.  2. __________.  3. Gastroesophageal reflux  disease.  4. History of chronic renal insufficiency.  Prior to this creatinine is 1.4     on admission.   DISCHARGE ACTIVITIES:  No lifting greater than 5 pounds.  No driving.  No  strenuous physical activities for three day post cath.   DISCHARGE DIET:  Low salt, low cholesterol diet.   DISCHARGE INSTRUCTIONS:  The patient was allowed to shower with instructions  to report any problems with the groin puncture site to her urologist and the  phone number was provided.  She is discharged to follow up.  She will be  seen by Nada Boozer nurse practitioner in our office on 06/21/2002 at  11:00.  At that time, we will reassess her groin and we will recheck her  blood pressure and make sure that she remains normotensive after we increase  the dose of Altace to 5 mg while she was in the hospital.       Raymon Mutton, P.A.                    Runell Gess, M.D.    MK/MEDQ  D:  06/08/2002  T:  06/09/2002  Job:  [id number]   cc:   [facility]

## 2011-02-15 NOTE — Cardiovascular Report (Signed)
   NAME:  [name], [name]                        ACCOUNT NO.:  [id number]   MEDICAL RECORD NO.:  [id number]                   PATIENT TYPE:  INP   LOCATION:  3711                                 FACILITY:  [facility]   PHYSICIAN:  Madaline Savage, M.D.             DATE OF BIRTH:  Oct 10, 1915   DATE OF PROCEDURE:  06/07/2002  DATE OF DISCHARGE:                              CARDIAC CATHETERIZATION   PROCEDURE PERFORMED:  1. Selective coronary angiography by Judkins technique.  2. Retrograde left heart catheterization.  3. Left ventricular angiography.   COMPLICATIONS:  None.   ENTRY SITE:  Right femoral.   DYE USED:  Omnipaque.   PATIENT PROFILE:  The patient is an 77 year old African-American female with  chronic renal failure, hypertension, and shortness of breath.  The patient  took a nitroglycerin yesterday for some chest pain and came to the emergency  room and was admitted by Dr. Allyson Sabal and scheduled for catheterization.  The  patient is on Celebrex.  She has a creatinine of 1.4.  Her EKG showed sinus  bradycardia and was otherwise normal.   The patient is said to have had a cardiac catheterization several years ago  by me.  I believe it was done at the [facility] and I do not  remember finding any abnormal pathology in her coronary arteries.   RESULTS:  1. Pressures     A. The left ventricular pressure was 170/14.     B. Central aortic pressure was 170/80.  Mean was 115.  2. Coronary angiography shows that the coronary arteries are normal.  The     left ventricle shows a supranormal ejection fraction estimated at 65-70%     without wall motion abnormalities.   IMPRESSION:  1. Normal coronary arteries.  2. Supranormal left ventricular systolic function.                                               Madaline Savage, M.D.    WHG/MEDQ  D:  06/07/2002  T:  06/08/2002  Job:  [id number]   cc:   Cardiac Catheterization Lab   [facility]

## 2011-02-17 ENCOUNTER — Inpatient Hospital Stay (HOSPITAL_COMMUNITY)
Admission: EM | Admit: 2011-02-17 | Discharge: 2011-02-19 | DRG: 313 | Disposition: A | Discharge status: Home / Self Care | Payer: Medicare Other | Attending: Cardiovascular Disease | Admitting: Cardiovascular Disease

## 2011-02-17 ENCOUNTER — Emergency Department (HOSPITAL_COMMUNITY): Payer: Medicare Other

## 2011-02-17 DIAGNOSIS — K449 Diaphragmatic hernia without obstruction or gangrene: Secondary | ICD-10-CM | POA: Diagnosis present

## 2011-02-17 DIAGNOSIS — E039 Hypothyroidism, unspecified: Secondary | ICD-10-CM | POA: Diagnosis present

## 2011-02-17 DIAGNOSIS — I10 Essential (primary) hypertension: Secondary | ICD-10-CM | POA: Diagnosis present

## 2011-02-17 DIAGNOSIS — R072 Precordial pain: Principal | ICD-10-CM | POA: Diagnosis present

## 2011-02-17 DIAGNOSIS — M1990 Unspecified osteoarthritis, unspecified site: Secondary | ICD-10-CM | POA: Diagnosis present

## 2011-02-17 DIAGNOSIS — F411 Generalized anxiety disorder: Secondary | ICD-10-CM | POA: Diagnosis present

## 2011-02-17 LAB — CK TOTAL AND CKMB (NOT AT ARMC)
CK, MB: 2.8 ng/mL (ref 0.3–4.0)
Relative Index: INVALID (ref 0.0–2.5)
Total CK: 57 U/L (ref 7–177)

## 2011-02-17 LAB — COMPREHENSIVE METABOLIC PANEL
AST: 15 U/L (ref 0–37)
BUN: 22 mg/dL (ref 6–23)
CO2: 29 mEq/L (ref 19–32)
Calcium: 9.7 mg/dL (ref 8.4–10.5)
Creatinine, Ser: 1.03 mg/dL (ref 0.4–1.2)
GFR calc Af Amer: >60 mL/min (ref >60)
GFR calc non Af Amer: 50 mL/min — ABNORMAL LOW (ref >60)
Glucose, Bld: 90 mg/dL (ref 70–99)

## 2011-02-17 LAB — CBC
Hemoglobin: 13 g/dL (ref 12.0–15.0)
MCH: 31 pg (ref 26.0–34.0)
MCHC: 33.6 g/dL (ref 30.0–36.0)

## 2011-02-17 LAB — POCT CARDIAC MARKERS: CKMB, poc: 2.1 ng/mL (ref 1.0–8.0)

## 2011-02-17 LAB — CARDIAC PANEL(CRET KIN+CKTOT+MB+TROPI): Total CK: 55 U/L (ref 7–177)

## 2011-02-17 LAB — TROPONIN I: Troponin I: <0.3 ng/mL (ref <0.30)

## 2011-02-18 LAB — CBC
MCV: 92.2 fL (ref 78.0–100.0)
Platelets: 186 10*3/uL (ref 150–400)
RBC: 3.61 MIL/uL — ABNORMAL LOW (ref 3.87–5.11)
WBC: 3.7 10*3/uL — ABNORMAL LOW (ref 4.0–10.5)

## 2011-02-18 LAB — CARDIAC PANEL(CRET KIN+CKTOT+MB+TROPI)
Relative Index: INVALID (ref 0.0–2.5)
Total CK: 45 U/L (ref 7–177)
Troponin I: <0.3 ng/mL (ref <0.30)

## 2011-02-18 LAB — HEPARIN LEVEL (UNFRACTIONATED): Heparin Unfractionated: 0.54 IU/mL (ref 0.30–0.70)

## 2011-02-18 LAB — LIPID PANEL
Cholesterol: 212 mg/dL — ABNORMAL HIGH (ref 0–200)
VLDL: 27 mg/dL (ref 0–40)

## 2011-02-22 NOTE — Discharge Summary (Signed)
  [name], [name]              ACCOUNT NO.:  [id number]  MEDICAL RECORD NO.:  [id number]           PATIENT TYPE:  I  LOCATION:  3735                         FACILITY:  [facility]  PHYSICIAN:  Ricki Rodriguez, M.D.  DATE OF BIRTH:  13-Feb-1916  DATE OF ADMISSION:  02/17/2011 DATE OF DISCHARGE:  02/19/2011                              DISCHARGE SUMMARY   FINAL DIAGNOSES: 1. Chest pain. 2. Hypertension. 3. Hypothyroidism. 4. Large diaphragmatic hernia. 5. Osteoarthritis. 6. Anxiety.  DISCHARGE MEDICATIONS: 1. Metoprolol XL succinate 25 mg 1 daily. 2. Rosuvastatin 5 mg 1/2 a tablet at bedtime. 3. Azor 5/20 mg 1 daily. 4. Aspirin 81 mg daily. 5. Colchicine 0.6 mg daily. 6. Levoxyl 50 mcg 1 tablet daily. 7. Nitroglycerin 0.4 mg tablet 1 sublingual q.5 minutes x3 as needed. 8. Soriatane 25 mg tablet once a week.  DISCHARGE DIET:  Low-sodium, heart-healthy diet.  DISCHARGE ACTIVITY:  The patient is to increase activity slowly astolerated.  FOLLOWUP:  By Dr. Orpah [name] in 2 weeks.  The patient is to call [phone] for appointment.  HISTORY OF PRESENT ILLNESS:  This is a 77 year old white female who presented with substernal chest pain with sweating spell.  The patient has palpitation also.  She has a large diaphragmatic hernia which gives her palpitation and chest pain off and on.  PHYSICAL EXAMINATION:  GENERAL:  The patient is alert, oriented x3, in no acute distress. VITAL SIGNS:  Pulse 70, respirations 18, blood pressure 123/70, temperature 97.7, and oxygen saturation 97% on room air. HEENT:  The patient is normocephalic and atraumatic with brown eyes. Conjunctiva are pink.  Sclerae are nonicteric.  The tongue is pink and midline. NECK:  No JVD.  No carotid bruit. LUNGS:  Clear bilaterally. HEART:  Normal S1 and S2. ABDOMEN:  Soft and nontender. EXTREMITIES:  Trace ankle edema.  No cyanosis or clubbing. CENTRAL NERVOUS SYSTEM:  Cranial nerves grossly intact.  The  patient is right-handed and has bilateral equal grips with some weakness due to arthritis. SKIN:  Warm and dry.  LABORATORY DATA:  Normal hemoglobin and hematocrit.  WBC count slightly low at 3400.  Normal platelet count.  Normal electrolytes, BUN, creatinine, and glucose.  Normal B-natriuretic peptide.  Normal CK-MB, troponin x3.  Lipid profile showed a slightly elevated cholesterol of 212 with LDL cholesterol of 140.  Chest x-ray, no acute cardiopulmonary disease.  HOSPITAL COURSE:  The patient was admitted to Telemetry Unit. Myocardial infarction was ruled out.  Rosuvastatin was added for her elevated cholesterol level and a small dose of metoprolol succinate 225 mg was added for symptoms of palpitation.  Her condition remained stable for the next 24 hours, hence she was discharged home in satisfactory condition with followup by me in 2 weeks.     Ricki Rodriguez, M.D.     ASK/MEDQ  D:  02/19/2011  T:  02/19/2011  Job:  [id number]  Electronically Signed by [name] M.D. on 02/22/2011 10:08:02 AM

## 2011-02-22 NOTE — H&P (Signed)
  [name], [name]              ACCOUNT NO.:  [id number]  MEDICAL RECORD NO.:  [id number]           PATIENT TYPE:  I  LOCATION:  3735                         FACILITY:  [facility]  PHYSICIAN:  Ricki Rodriguez, M.D.  DATE OF BIRTH:  01-13-1916  DATE OF ADMISSION:  02/17/2011 DATE OF DISCHARGE:                             HISTORY & PHYSICAL   CHIEF COMPLAINT:  Chest pain.  HISTORY OF PRESENT ILLNESS:  The patient is a 77 year old black female with chronic history of chest pain, palpitation, and a large hiatal hernia, had another episode of chest pain and palpitation.  PAST MEDICAL HISTORY: 1. Positive for diabetes mellitus, diet controlled. 2. Hypertension for long time. 3. Positive history of elevated cholesterol level. 4. Negative history of smoking, alcohol intake or drug intake.  PAST SURGICAL HISTORY:  Hysterectomy in 1940s, cataract surgery of the left eye many years ago.  PERSONAL HISTORY:  The patient is widowed, husband died in 02/27/62 from cerebral hemorrhage at age 36.  The patient has one daughter.  She lives alone.  FAMILY HISTORY:  Mother died at age 33 with some kind of cancer.  Father died of heart disease at age 74.  The patient does not have any brother, has four sisters and all of them are living.  REVIEW OF SYSTEMS:  The patient denies recent weight gain or weight loss.  She wears glasses and dentures.  No history of cough, no history of sinus drainage.  No history of headache or hemoptysis.  Occasional chest pain, palpitations, and dyspnea.  No history of asthma, GI bleed, hepatitis, blood transfusion.  Positive history of kidney stone.  No history of stroke, seizures or psychiatric admissions.  Positive joint pains.  PHYSICAL EXAMINATION:  GENERAL:  The patient is an averagely built and nourished black female in no acute distress. VITAL SIGNS:  Pulse 75, respirations 18, blood pressure 142/62, temperature 97.7, and oxygen saturation 99%. HEENT:  The  patient is normocephalic and atraumatic with brown eyes. Conjunctivae pink.  Sclerae nonicteric.  Tongue midline and wet. NECK:  No JVD.  No carotid bruit. LUNGS:  Clear bilaterally. HEART:  Normal S1 and S2 with grade 2/6 systolic murmur at the left sternal border. ABDOMEN:  Soft and nontender. EXTREMITIES:  Trace edema, no cyanosis or clubbing. CNS: Cranial nerves grossly intact.  Bilaterally equal grips.  The patient is right handed. SKIN:  Warm and dry.  LABORATORY DATA:  Normal hemoglobin and hematocrit.  WBC count slightly low at 34,000, platelet count normal, electrolytes normal, BUN 22, creatinine 1.03, glucose normal at 90 mg/dL.  Liver enzymes normal. Chest x-ray, moderate hiatal hernia, no acute cardiopulmonary disease.  DIAGNOSES: 1. Chest pain. 2. Hypertension. 3. Palpitation. 4. Generalized weakness. 5. Anxiety.  Plan is to place the patient in observation rule out for MI.  Continue home medications.     Ricki Rodriguez, M.D.     ASK/MEDQ  D:  02/17/2011  T:  02/17/2011  Job:  [id number]  Electronically Signed by Orpah Cobb M.D. on 02/22/2011 10:07:49 AM

## 2011-05-14 ENCOUNTER — Observation Stay (HOSPITAL_COMMUNITY)
Admission: EM | Admit: 2011-05-14 | Discharge: 2011-05-16 | Disposition: A | Discharge status: Home / Self Care | Payer: Medicare Other | Attending: Cardiovascular Disease | Admitting: Cardiovascular Disease

## 2011-05-14 ENCOUNTER — Emergency Department (HOSPITAL_COMMUNITY): Payer: Medicare Other

## 2011-05-14 DIAGNOSIS — R110 Nausea: Secondary | ICD-10-CM | POA: Insufficient documentation

## 2011-05-14 DIAGNOSIS — R0609 Other forms of dyspnea: Secondary | ICD-10-CM | POA: Insufficient documentation

## 2011-05-14 DIAGNOSIS — I10 Essential (primary) hypertension: Secondary | ICD-10-CM | POA: Insufficient documentation

## 2011-05-14 DIAGNOSIS — E039 Hypothyroidism, unspecified: Secondary | ICD-10-CM | POA: Insufficient documentation

## 2011-05-14 DIAGNOSIS — R61 Generalized hyperhidrosis: Secondary | ICD-10-CM | POA: Insufficient documentation

## 2011-05-14 DIAGNOSIS — F411 Generalized anxiety disorder: Secondary | ICD-10-CM | POA: Insufficient documentation

## 2011-05-14 DIAGNOSIS — M1990 Unspecified osteoarthritis, unspecified site: Secondary | ICD-10-CM | POA: Insufficient documentation

## 2011-05-14 DIAGNOSIS — R079 Chest pain, unspecified: Principal | ICD-10-CM | POA: Insufficient documentation

## 2011-05-14 DIAGNOSIS — K449 Diaphragmatic hernia without obstruction or gangrene: Secondary | ICD-10-CM | POA: Insufficient documentation

## 2011-05-14 DIAGNOSIS — R0989 Other specified symptoms and signs involving the circulatory and respiratory systems: Secondary | ICD-10-CM | POA: Insufficient documentation

## 2011-05-14 LAB — DIFFERENTIAL
Basophils Absolute: 0 10*3/uL (ref 0.0–0.1)
Basophils Relative: 1 % (ref 0–1)
Eosinophils Absolute: 0.1 10*3/uL (ref 0.0–0.7)
Lymphocytes Relative: 54 % — ABNORMAL HIGH (ref 12–46)
Lymphocytes Relative: 54 % — ABNORMAL HIGH (ref 12–46)
Lymphs Abs: 1.7 10*3/uL (ref 0.7–4.0)
Monocytes Relative: 10 % (ref 3–12)
Monocytes Relative: 11 % (ref 3–12)
Neutro Abs: 1 10*3/uL — ABNORMAL LOW (ref 1.7–7.7)
Neutro Abs: 1 10*3/uL — ABNORMAL LOW (ref 1.7–7.7)
Neutrophils Relative %: 32 % — ABNORMAL LOW (ref 43–77)
Neutrophils Relative %: 31 % — ABNORMAL LOW (ref 43–77)

## 2011-05-14 LAB — CBC
Hemoglobin: 11.9 g/dL — ABNORMAL LOW (ref 12.0–15.0)
Hemoglobin: 12.1 g/dL (ref 12.0–15.0)
MCH: 30.8 pg (ref 26.0–34.0)
MCH: 30.9 pg (ref 26.0–34.0)
MCV: 91.5 fL (ref 78.0–100.0)
MCV: 91.1 fL (ref 78.0–100.0)
Platelets: 207 10*3/uL (ref 150–400)
RBC: 3.86 MIL/uL — ABNORMAL LOW (ref 3.87–5.11)
RBC: 3.92 MIL/uL (ref 3.87–5.11)
WBC: 3.1 10*3/uL — ABNORMAL LOW (ref 4.0–10.5)
WBC: 3.2 10*3/uL — ABNORMAL LOW (ref 4.0–10.5)

## 2011-05-14 LAB — CK TOTAL AND CKMB (NOT AT ARMC)
CK, MB: 2.2 ng/mL (ref 0.3–4.0)
Total CK: 40 U/L (ref 7–177)

## 2011-05-14 LAB — CARDIAC PANEL(CRET KIN+CKTOT+MB+TROPI)
Relative Index: INVALID (ref 0.0–2.5)
Troponin I: <0.3 ng/mL (ref <0.30)

## 2011-05-14 LAB — POCT I-STAT, CHEM 8
BUN: 25 mg/dL — ABNORMAL HIGH (ref 6–23)
Chloride: 101 mEq/L (ref 96–112)
HCT: 38 % (ref 36.0–46.0)
Potassium: 4.1 mEq/L (ref 3.5–5.1)
Sodium: 140 mEq/L (ref 135–145)

## 2011-05-14 LAB — PROTIME-INR: Prothrombin Time: 14.1 seconds (ref 11.6–15.2)

## 2011-05-14 LAB — COMPREHENSIVE METABOLIC PANEL
CO2: 32 mEq/L (ref 19–32)
Calcium: 9.4 mg/dL (ref 8.4–10.5)
Creatinine, Ser: 0.99 mg/dL (ref 0.50–1.10)
GFR calc Af Amer: >60 mL/min (ref >60)
GFR calc non Af Amer: 52 mL/min — ABNORMAL LOW (ref >60)
Glucose, Bld: 83 mg/dL (ref 70–99)
Sodium: 139 mEq/L (ref 135–145)
Total Protein: 6.7 g/dL (ref 6.0–8.3)

## 2011-05-14 LAB — TSH: TSH: 4.558 u[IU]/mL — ABNORMAL HIGH (ref 0.350–4.500)

## 2011-05-14 LAB — LIPID PANEL
Cholesterol: 232 mg/dL — ABNORMAL HIGH (ref 0–200)
HDL: 76 mg/dL (ref >39)
Total CHOL/HDL Ratio: 3.1 RATIO
Triglycerides: 43 mg/dL (ref <150)

## 2011-05-14 LAB — APTT: aPTT: 34 seconds (ref 24–37)

## 2011-05-14 LAB — HEPARIN LEVEL (UNFRACTIONATED): Heparin Unfractionated: 0.44 IU/mL (ref 0.30–0.70)

## 2011-05-15 LAB — CBC
Platelets: 212 10*3/uL (ref 150–400)
RBC: 3.58 MIL/uL — ABNORMAL LOW (ref 3.87–5.11)
RDW: 13.8 % (ref 11.5–15.5)
WBC: 3.4 10*3/uL — ABNORMAL LOW (ref 4.0–10.5)

## 2011-05-15 LAB — CARDIAC PANEL(CRET KIN+CKTOT+MB+TROPI)
CK, MB: 2.3 ng/mL (ref 0.3–4.0)
Relative Index: INVALID (ref 0.0–2.5)
Total CK: 41 U/L (ref 7–177)

## 2011-05-15 LAB — HEPARIN LEVEL (UNFRACTIONATED): Heparin Unfractionated: 0.58 IU/mL (ref 0.30–0.70)

## 2011-05-16 LAB — CBC
Hemoglobin: 11.1 g/dL — ABNORMAL LOW (ref 12.0–15.0)
Platelets: 200 10*3/uL (ref 150–400)
RBC: 3.65 MIL/uL — ABNORMAL LOW (ref 3.87–5.11)
WBC: 3.1 10*3/uL — ABNORMAL LOW (ref 4.0–10.5)

## 2011-05-16 LAB — HEPARIN LEVEL (UNFRACTIONATED): Heparin Unfractionated: <0.1 IU/mL — ABNORMAL LOW (ref 0.30–0.70)

## 2011-05-16 LAB — PROTIME-INR
INR: 1.06 (ref 0.00–1.49)
Prothrombin Time: 14 seconds (ref 11.6–15.2)

## 2011-05-20 NOTE — Discharge Summary (Signed)
[name], [name]              ACCOUNT NO.:  [id number]  MEDICAL RECORD NO.:  [id number]  LOCATION:  2011                         FACILITY:  [facility]  PHYSICIAN:  Ricki Rodriguez, M.D.  DATE OF BIRTH:  [birth year]  DATE OF ADMISSION:  05/14/2011 DATE OF DISCHARGE:  05/16/2011                              DISCHARGE SUMMARY   ADMITTING PHYSICIAN:  Mohan N. Sharyn Lull, M.D.  ATTENDING PHYSICIAN:  Ricki Rodriguez, M.D.  DISCHARGING PHYSICIAN:  Ricki Rodriguez, M.D.  FINAL DIAGNOSES: 1. Chest pain. 2. Hypertension. 3. Hypothyroidism. 4. Large diaphragmatic hernia. 5. Osteoarthritis. 6. Anxiety.  DISCHARGE MEDICATIONS: 1. Nitroglycerin patch 0.2 mg/hr, take off at bedtime and place new     one in the morning. 2. Azor 5/20 mg one daily. 3. Aspirin 81 mg daily. 4. Levoxyl 50 mcg one daily. 5. Nitroglycerin sublingual tablet 0.4 mg one under the tongue every 5     minutes up to three doses as needed. 6. Soriatane 25 mg one daily.7. Vitamin D over-the-counter 1000 units one daily.  DISCHARGE DIET:  Low-sodium heart-healthy diet.  DISCHARGE ACTIVITY:  The patient is to increase activity gradually as tolerated and use walker for ambulation and balance.  FOLLOWUP:  Follow-up by Dr. Orpah Cobb in 2 weeks.  The patient is to call [phone] for appointment.  HISTORY:  This is a 77 year old black female presented with substernal chest pain with some relief with nitroglycerin use.  The patient has a large diaphragmatic hernia, this gives her palpitations and chest pain off and on.  PHYSICAL EXAMINATION:  GENERAL:  The patient is well-built and averagely nourished female, in no acute distress. VITAL SIGNS:  Temperature 97.6, pulse 61, respirations 18, blood pressure 169/72, oxygen saturation 99% on room air. HEENT:  The patient is normocephalic, atraumatic.  Has brown eyes. Conjunctivae are pink.  Sclerae nonicteric.  Tongue pink and midline. NECK:  No JVD. LUNGS:  Clear  bilaterally. HEART:  Normal S1, S2. ABDOMEN:  Soft and nontender. EXTREMITIES:  Reveal trace ankle edema, no cyanosis or clubbing. CNS:  Cranial nerves grossly intact.  The patient is right-handed, has bilateral equal grips with chronic weakness from arthritis. SKIN:  Warm and dry.  LABORATORY DATA:  Normal hemoglobin, hematocrit, and platelet count, WBC count chronically low at 3200.  Electrolytes normal, BUN and creatinine normal.  Blood glucose normal.  Liver function test normal.  Lipid profile showed cholesterol of 232 with LDL cholesterol of 147, HDL cholesterol of 76 and triglyceride of 43.  Thyroid stimulating hormone borderline at 4.558.  Cardiac enzymes negative x3.  INR normal at 1.06.  Chest x-ray, emphysematous changes and esophageal hiatal hernia behind the heart, no active pulmonary disease.  HOSPITAL COURSE:  The patient was admitted to telemetry unit. Myocardial infarction was ruled out with her advanced age.  The patient was treated medically per patient request.  Her condition remained stable in hospital and on May 16, 2011, she was discharged home in satisfactory condition with follow-up by me in 2 weeks.     Ricki Rodriguez, M.D.     ASK/MEDQ  D:  05/16/2011  T:  05/16/2011  Job:  [id number]  Electronically Signed by Orpah Cobb  M.D. on 05/20/2011 04:55:30 AM

## 2011-06-21 LAB — CBC
MCV: 92.1
Platelets: 213
RBC: 3.73 — ABNORMAL LOW
WBC: 3.6 — ABNORMAL LOW

## 2011-06-21 LAB — POCT CARDIAC MARKERS
CKMB, poc: <1 — ABNORMAL LOW
CKMB, poc: <1 — ABNORMAL LOW
Myoglobin, poc: 110
Operator id: 272551
Operator id: 272551
Troponin i, poc: <0.05

## 2011-06-21 LAB — I-STAT 8, (EC8 V) (CONVERTED LAB)
Glucose, Bld: 101 — ABNORMAL HIGH
Potassium: 4.2
TCO2: 29
pCO2, Ven: 45.5
pH, Ven: 7.388 — ABNORMAL HIGH

## 2011-06-21 LAB — DIFFERENTIAL
Eosinophils Absolute: 0.1
Lymphocytes Relative: 49 — ABNORMAL HIGH
Lymphs Abs: 1.8
Neutro Abs: 1.4 — ABNORMAL LOW
Neutrophils Relative %: 38 — ABNORMAL LOW

## 2011-06-21 LAB — POCT I-STAT CREATININE: Operator id: 272551

## 2011-07-02 ENCOUNTER — Emergency Department (HOSPITAL_COMMUNITY)
Admission: EM | Admit: 2011-07-02 | Discharge: 2011-07-02 | Disposition: A | Discharge status: Home / Self Care | Payer: Medicare Other | Attending: Emergency Medicine | Admitting: Emergency Medicine

## 2011-07-02 ENCOUNTER — Emergency Department (HOSPITAL_COMMUNITY): Payer: Medicare Other

## 2011-07-02 DIAGNOSIS — E039 Hypothyroidism, unspecified: Secondary | ICD-10-CM | POA: Insufficient documentation

## 2011-07-02 DIAGNOSIS — I10 Essential (primary) hypertension: Secondary | ICD-10-CM | POA: Insufficient documentation

## 2011-07-02 DIAGNOSIS — M79609 Pain in unspecified limb: Secondary | ICD-10-CM | POA: Insufficient documentation

## 2011-07-02 DIAGNOSIS — R0789 Other chest pain: Secondary | ICD-10-CM | POA: Insufficient documentation

## 2011-07-02 LAB — POCT I-STAT, CHEM 8
Calcium, Ion: 1.14 mmol/L (ref 1.12–1.32)
Chloride: 101 mEq/L (ref 96–112)
HCT: 38 % (ref 36.0–46.0)
TCO2: 28 mmol/L (ref 0–100)

## 2011-07-02 LAB — DIFFERENTIAL
Basophils Absolute: 0 10*3/uL (ref 0.0–0.1)
Basophils Relative: 0 % (ref 0–1)
Eosinophils Absolute: 0.1 10*3/uL (ref 0.0–0.7)
Eosinophils Relative: 2 % (ref 0–5)
Monocytes Absolute: 0.3 10*3/uL (ref 0.1–1.0)

## 2011-07-02 LAB — POCT I-STAT TROPONIN I
Troponin i, poc: 0 ng/mL (ref 0.00–0.08)
Troponin i, poc: 0 ng/mL (ref 0.00–0.08)

## 2011-07-02 LAB — CBC
MCHC: 33.8 g/dL (ref 30.0–36.0)
Platelets: 211 10*3/uL (ref 150–400)
RDW: 13.7 % (ref 11.5–15.5)

## 2011-10-11 DIAGNOSIS — L408 Other psoriasis: Secondary | ICD-10-CM | POA: Diagnosis not present

## 2011-11-05 DIAGNOSIS — M2550 Pain in unspecified joint: Secondary | ICD-10-CM | POA: Diagnosis not present

## 2011-11-05 DIAGNOSIS — N259 Disorder resulting from impaired renal tubular function, unspecified: Secondary | ICD-10-CM | POA: Diagnosis not present

## 2011-11-05 DIAGNOSIS — I10 Essential (primary) hypertension: Secondary | ICD-10-CM | POA: Diagnosis not present

## 2011-11-05 DIAGNOSIS — R002 Palpitations: Secondary | ICD-10-CM | POA: Diagnosis not present

## 2011-11-11 DIAGNOSIS — R42 Dizziness and giddiness: Secondary | ICD-10-CM | POA: Diagnosis not present

## 2011-11-11 DIAGNOSIS — R404 Transient alteration of awareness: Secondary | ICD-10-CM | POA: Diagnosis not present

## 2011-12-05 DIAGNOSIS — E876 Hypokalemia: Secondary | ICD-10-CM | POA: Diagnosis not present

## 2011-12-05 DIAGNOSIS — Z79899 Other long term (current) drug therapy: Secondary | ICD-10-CM | POA: Diagnosis not present

## 2011-12-05 DIAGNOSIS — D649 Anemia, unspecified: Secondary | ICD-10-CM | POA: Diagnosis not present

## 2011-12-05 DIAGNOSIS — E559 Vitamin D deficiency, unspecified: Secondary | ICD-10-CM | POA: Diagnosis not present

## 2011-12-05 DIAGNOSIS — E785 Hyperlipidemia, unspecified: Secondary | ICD-10-CM | POA: Diagnosis not present

## 2011-12-05 DIAGNOSIS — E039 Hypothyroidism, unspecified: Secondary | ICD-10-CM | POA: Diagnosis not present

## 2012-01-02 DIAGNOSIS — F411 Generalized anxiety disorder: Secondary | ICD-10-CM | POA: Diagnosis not present

## 2012-01-02 DIAGNOSIS — R079 Chest pain, unspecified: Secondary | ICD-10-CM | POA: Diagnosis not present

## 2012-01-02 DIAGNOSIS — I10 Essential (primary) hypertension: Secondary | ICD-10-CM | POA: Diagnosis not present

## 2012-01-02 DIAGNOSIS — E785 Hyperlipidemia, unspecified: Secondary | ICD-10-CM | POA: Diagnosis not present

## 2012-01-30 DIAGNOSIS — M779 Enthesopathy, unspecified: Secondary | ICD-10-CM | POA: Diagnosis not present

## 2012-01-30 DIAGNOSIS — M79609 Pain in unspecified limb: Secondary | ICD-10-CM | POA: Diagnosis not present

## 2012-01-30 DIAGNOSIS — B351 Tinea unguium: Secondary | ICD-10-CM | POA: Diagnosis not present

## 2012-04-01 DIAGNOSIS — M2550 Pain in unspecified joint: Secondary | ICD-10-CM | POA: Diagnosis not present

## 2012-04-01 DIAGNOSIS — R079 Chest pain, unspecified: Secondary | ICD-10-CM | POA: Diagnosis not present

## 2012-04-01 DIAGNOSIS — R002 Palpitations: Secondary | ICD-10-CM | POA: Diagnosis not present

## 2012-04-01 DIAGNOSIS — I10 Essential (primary) hypertension: Secondary | ICD-10-CM | POA: Diagnosis not present

## 2012-04-18 ENCOUNTER — Encounter (HOSPITAL_COMMUNITY): Payer: Self-pay

## 2012-04-18 ENCOUNTER — Emergency Department (HOSPITAL_COMMUNITY)
Admission: EM | Admit: 2012-04-18 | Discharge: 2012-04-18 | Disposition: A | Discharge status: Home / Self Care | Payer: Medicare Other | Attending: Emergency Medicine | Admitting: Emergency Medicine

## 2012-04-18 DIAGNOSIS — R002 Palpitations: Secondary | ICD-10-CM | POA: Diagnosis not present

## 2012-04-18 DIAGNOSIS — R51 Headache: Secondary | ICD-10-CM | POA: Diagnosis not present

## 2012-04-18 DIAGNOSIS — R6889 Other general symptoms and signs: Secondary | ICD-10-CM | POA: Diagnosis not present

## 2012-04-18 DIAGNOSIS — E079 Disorder of thyroid, unspecified: Secondary | ICD-10-CM | POA: Insufficient documentation

## 2012-04-18 DIAGNOSIS — I10 Essential (primary) hypertension: Secondary | ICD-10-CM | POA: Insufficient documentation

## 2012-04-18 DIAGNOSIS — R52 Pain, unspecified: Secondary | ICD-10-CM | POA: Diagnosis not present

## 2012-04-18 DIAGNOSIS — M25579 Pain in unspecified ankle and joints of unspecified foot: Secondary | ICD-10-CM | POA: Diagnosis not present

## 2012-04-18 DIAGNOSIS — M109 Gout, unspecified: Secondary | ICD-10-CM | POA: Insufficient documentation

## 2012-04-18 DIAGNOSIS — M25569 Pain in unspecified knee: Secondary | ICD-10-CM | POA: Diagnosis not present

## 2012-04-18 DIAGNOSIS — I119 Hypertensive heart disease without heart failure: Secondary | ICD-10-CM | POA: Diagnosis not present

## 2012-04-18 HISTORY — DX: Atherosclerotic heart disease of native coronary artery without angina pectoris: I25.10

## 2012-04-18 HISTORY — DX: Essential (primary) hypertension: I10

## 2012-04-18 HISTORY — DX: Disorder of thyroid, unspecified: E07.9

## 2012-04-18 HISTORY — DX: Gout, unspecified: M10.9

## 2012-04-18 LAB — BASIC METABOLIC PANEL
BUN: 20 mg/dL (ref 6–23)
Chloride: 104 mEq/L (ref 96–112)
GFR calc Af Amer: 52 mL/min — ABNORMAL LOW (ref >90)
Potassium: 4.9 mEq/L (ref 3.5–5.1)

## 2012-04-18 LAB — CBC
HCT: 34.6 % — ABNORMAL LOW (ref 36.0–46.0)
Hemoglobin: 11.5 g/dL — ABNORMAL LOW (ref 12.0–15.0)
MCHC: 33.2 g/dL (ref 30.0–36.0)

## 2012-04-18 MED ORDER — IBUPROFEN 200 MG PO TABS
200.0000 mg | ORAL_TABLET | Freq: Once | ORAL | Status: AC
  Administered 2012-04-18: 600 mg via ORAL
  Filled 2012-04-18: qty 3

## 2012-04-18 NOTE — ED Provider Notes (Signed)
History     CSN: [id number]  Arrival date & time 04/18/12  6295   First MD Initiated Contact with Patient 04/18/12 [phone]      Chief complaint: heart racing, ?swollen vein forehead, bil knee and ankle pain  (Consider location/radiation/quality/duration/timing/severity/associated sxs/prior treatment) The history is provided by the patient.  pt states was up a good part of night. Was concerned with her blood pressure, stating it was high. Also indicates felt that heart was racing earlier. Unsure how long it lasted. Felt fast. Denies faintness or syncope. Denies associated cp, sob, or diaphoresis. Denies hx dysrhythmia. Also states has pain/soreness in bil knees and ankles 'for a while'. Denies trauma or fall. No leg swelling. ?hx arthritis. Pt denies any focal joint redness or swelling. No fever or chills.  Pt denies headache or head pain. No numbness/weakness.    Past Medical History  Diagnosis Date  . Hypertension   . Gout   . Thyroid disease   . Coronary artery disease     Past Surgical History  Procedure Date  . Abdominal hysterectomy   . Appendectomy     History reviewed. No pertinent family history.  History  Substance Use Topics  . Smoking status: Never Smoker   . Smokeless tobacco: Not on file  . Alcohol Use: No    OB History    Grav Para Term Preterm Abortions TAB SAB Ect Mult Living                  Review of Systems  Constitutional: Negative for fever and chills.  HENT: Negative for neck pain.   Eyes: Negative for pain and visual disturbance.  Respiratory: Negative for shortness of breath.   Cardiovascular: Positive for palpitations. Negative for chest pain and leg swelling.  Gastrointestinal: Negative for nausea, vomiting and abdominal pain.  Genitourinary: Negative for flank pain.  Musculoskeletal: Negative for back pain.  Skin: Negative for rash.  Neurological: Negative for headaches.  Hematological: Does not bruise/bleed easily.    Allergies    Crestor  Home Medications   Current Outpatient Rx  Name Route Sig Dispense Refill  . ACETAMINOPHEN 325 MG PO TABS Oral Take 650 mg by mouth every 6 (six) hours as needed. For pain    . AMLODIPINE-OLMESARTAN 5-20 MG PO TABS Oral Take 1 tablet by mouth daily.    . ASPIRIN 81 MG PO CHEW Oral Chew 81 mg by mouth daily.    Marland Kitchen VITAMIN D PO Oral Take 1 tablet by mouth daily.    . FUROSEMIDE 20 MG PO TABS Oral Take 20 mg by mouth daily.    Marland Kitchen LEVOTHYROXINE SODIUM 75 MCG PO TABS Oral Take 75 mcg by mouth daily.      BP 172/65  Pulse 58  Temp 97.9 F (36.6 C) (Oral)  Resp 14  SpO2 100%  Physical Exam  Nursing note and vitals reviewed. Constitutional: She is oriented to person, place, and time. She appears well-developed and well-nourished. No distress.  HENT:  Head: Atraumatic.  Nose: Nose normal.  Mouth/Throat: Oropharynx is clear and moist.       No sinus or temporal tenderness. No skin changes, erythema, or lesions/shingles on scalp or face.   Eyes: Conjunctivae and EOM are normal. Pupils are equal, round, and reactive to light. No scleral icterus.  Neck: Neck supple. No tracheal deviation present. No thyromegaly present.       No stiffness or rigidity.   Cardiovascular: Normal rate, regular rhythm, normal heart sounds and intact  distal pulses.  Exam reveals no gallop and no friction rub.   No murmur heard. Pulmonary/Chest: Effort normal and breath sounds normal. No respiratory distress.  Abdominal: Soft. Normal appearance and bowel sounds are normal. She exhibits no distension. There is no tenderness.  Genitourinary:       No cva tenderness.  Musculoskeletal: Normal range of motion. She exhibits no edema and no tenderness.  Neurological: She is alert and oriented to person, place, and time. No cranial nerve deficit.       Motor intact bilaterally.  Skin: Skin is warm and dry. No rash noted. She is not diaphoretic.  Psychiatric: She has a normal mood and affect.    ED Course   Procedures (including critical care time)   Labs Reviewed  BASIC METABOLIC PANEL  CBC    Results for orders placed during the hospital encounter of 04/18/12  BASIC METABOLIC PANEL      Component Value Range   Sodium 139  135 - 145 mEq/L   Potassium 4.9  3.5 - 5.1 mEq/L   Chloride 104  96 - 112 mEq/L   CO2 29  19 - 32 mEq/L   Glucose, Bld 86  70 - 99 mg/dL   BUN 20  6 - 23 mg/dL   Creatinine, Ser 1.61  0.50 - 1.10 mg/dL   Calcium 9.3  8.4 - 09.6 mg/dL   GFR calc non Af Amer 45 (*) >90 mL/min   GFR calc Af Amer 52 (*) >90 mL/min  CBC      Component Value Range   WBC 3.0 (*) 4.0 - 10.5 K/uL   RBC 3.80 (*) 3.87 - 5.11 MIL/uL   Hemoglobin 11.5 (*) 12.0 - 15.0 g/dL   HCT 04.5 (*) 40.9 - 81.1 %   MCV 91.1  78.0 - 100.0 fL   MCH 30.3  26.0 - 34.0 pg   MCHC 33.2  30.0 - 36.0 g/dL   RDW 91.4  78.2 - 95.6 %   Platelets 198  150 - 400 K/uL      MDM  Monitor.   Reviewed nursing notes and prior charts for additional history.     Date: 04/18/2012  Rate: 58  Rhythm: normal sinus rhythm  QRS Axis: normal  Intervals: normal  ST/T Wave abnormalities: normal  Conduction Disutrbances:none  Narrative Interpretation:   Old EKG Reviewed: unchanged   Labs c/w baseline.   Recheck remains in nsr, no dysrhythmia.   On recheck no c/o x soreness bil knees and ankles. No erythema or swelling to areas. Good rom without pain.  Ibuprofen po.      Suzi Roots, MD 04/18/12 1041

## 2012-04-18 NOTE — ED Notes (Signed)
EMS reports patient awake all night, sudden onset headache pain 7/10, left temple,

## 2012-04-18 NOTE — ED Notes (Signed)
Called and spoke with Tresa Endo [facility]) to arrange non-emergency transportation back to her residence

## 2012-04-21 DIAGNOSIS — I10 Essential (primary) hypertension: Secondary | ICD-10-CM | POA: Diagnosis not present

## 2012-04-21 DIAGNOSIS — M2550 Pain in unspecified joint: Secondary | ICD-10-CM | POA: Diagnosis not present

## 2012-04-21 DIAGNOSIS — R002 Palpitations: Secondary | ICD-10-CM | POA: Diagnosis not present

## 2012-04-21 DIAGNOSIS — R079 Chest pain, unspecified: Secondary | ICD-10-CM | POA: Diagnosis not present

## 2012-04-30 DIAGNOSIS — M79609 Pain in unspecified limb: Secondary | ICD-10-CM | POA: Diagnosis not present

## 2012-04-30 DIAGNOSIS — B351 Tinea unguium: Secondary | ICD-10-CM | POA: Diagnosis not present

## 2012-05-04 DIAGNOSIS — M159 Polyosteoarthritis, unspecified: Secondary | ICD-10-CM | POA: Diagnosis not present

## 2012-05-04 DIAGNOSIS — N390 Urinary tract infection, site not specified: Secondary | ICD-10-CM | POA: Diagnosis not present

## 2012-05-04 DIAGNOSIS — H908 Mixed conductive and sensorineural hearing loss, unspecified: Secondary | ICD-10-CM | POA: Diagnosis not present

## 2012-05-04 DIAGNOSIS — I10 Essential (primary) hypertension: Secondary | ICD-10-CM | POA: Diagnosis not present

## 2012-05-04 DIAGNOSIS — E039 Hypothyroidism, unspecified: Secondary | ICD-10-CM | POA: Diagnosis not present

## 2012-06-23 DIAGNOSIS — I10 Essential (primary) hypertension: Secondary | ICD-10-CM | POA: Diagnosis not present

## 2012-06-23 DIAGNOSIS — R079 Chest pain, unspecified: Secondary | ICD-10-CM | POA: Diagnosis not present

## 2012-06-23 DIAGNOSIS — M25569 Pain in unspecified knee: Secondary | ICD-10-CM | POA: Diagnosis not present

## 2012-06-23 DIAGNOSIS — R002 Palpitations: Secondary | ICD-10-CM | POA: Diagnosis not present

## 2012-08-06 DIAGNOSIS — M79609 Pain in unspecified limb: Secondary | ICD-10-CM | POA: Diagnosis not present

## 2012-08-06 DIAGNOSIS — B351 Tinea unguium: Secondary | ICD-10-CM | POA: Diagnosis not present

## 2012-08-18 DIAGNOSIS — I10 Essential (primary) hypertension: Secondary | ICD-10-CM | POA: Diagnosis not present

## 2012-08-18 DIAGNOSIS — E785 Hyperlipidemia, unspecified: Secondary | ICD-10-CM | POA: Diagnosis not present

## 2012-08-18 DIAGNOSIS — M2550 Pain in unspecified joint: Secondary | ICD-10-CM | POA: Diagnosis not present

## 2012-08-18 DIAGNOSIS — R079 Chest pain, unspecified: Secondary | ICD-10-CM | POA: Diagnosis not present

## 2012-10-12 DIAGNOSIS — Z23 Encounter for immunization: Secondary | ICD-10-CM | POA: Diagnosis not present

## 2012-10-20 DIAGNOSIS — I10 Essential (primary) hypertension: Secondary | ICD-10-CM | POA: Diagnosis not present

## 2012-10-20 DIAGNOSIS — M25569 Pain in unspecified knee: Secondary | ICD-10-CM | POA: Diagnosis not present

## 2012-10-20 DIAGNOSIS — R079 Chest pain, unspecified: Secondary | ICD-10-CM | POA: Diagnosis not present

## 2012-11-05 DIAGNOSIS — B351 Tinea unguium: Secondary | ICD-10-CM | POA: Diagnosis not present

## 2012-11-05 DIAGNOSIS — M79609 Pain in unspecified limb: Secondary | ICD-10-CM | POA: Diagnosis not present

## 2012-12-10 ENCOUNTER — Emergency Department (HOSPITAL_COMMUNITY)
Admission: EM | Admit: 2012-12-10 | Discharge: 2012-12-10 | Disposition: A | Discharge status: Home / Self Care | Payer: Medicare Other | Attending: Emergency Medicine | Admitting: Emergency Medicine

## 2012-12-10 ENCOUNTER — Encounter (HOSPITAL_COMMUNITY): Payer: Self-pay | Admitting: Emergency Medicine

## 2012-12-10 ENCOUNTER — Emergency Department (HOSPITAL_COMMUNITY): Payer: Medicare Other

## 2012-12-10 DIAGNOSIS — E079 Disorder of thyroid, unspecified: Secondary | ICD-10-CM | POA: Diagnosis not present

## 2012-12-10 DIAGNOSIS — M545 Low back pain, unspecified: Secondary | ICD-10-CM | POA: Diagnosis not present

## 2012-12-10 DIAGNOSIS — IMO0001 Reserved for inherently not codable concepts without codable children: Secondary | ICD-10-CM | POA: Diagnosis not present

## 2012-12-10 DIAGNOSIS — R011 Cardiac murmur, unspecified: Secondary | ICD-10-CM | POA: Insufficient documentation

## 2012-12-10 DIAGNOSIS — M542 Cervicalgia: Secondary | ICD-10-CM | POA: Insufficient documentation

## 2012-12-10 DIAGNOSIS — Z8739 Personal history of other diseases of the musculoskeletal system and connective tissue: Secondary | ICD-10-CM | POA: Diagnosis not present

## 2012-12-10 DIAGNOSIS — Z862 Personal history of diseases of the blood and blood-forming organs and certain disorders involving the immune mechanism: Secondary | ICD-10-CM | POA: Diagnosis not present

## 2012-12-10 DIAGNOSIS — R5381 Other malaise: Secondary | ICD-10-CM | POA: Diagnosis not present

## 2012-12-10 DIAGNOSIS — R0789 Other chest pain: Secondary | ICD-10-CM | POA: Diagnosis not present

## 2012-12-10 DIAGNOSIS — I2510 Atherosclerotic heart disease of native coronary artery without angina pectoris: Secondary | ICD-10-CM | POA: Diagnosis not present

## 2012-12-10 DIAGNOSIS — I10 Essential (primary) hypertension: Secondary | ICD-10-CM | POA: Insufficient documentation

## 2012-12-10 DIAGNOSIS — Z7982 Long term (current) use of aspirin: Secondary | ICD-10-CM | POA: Diagnosis not present

## 2012-12-10 DIAGNOSIS — Z79899 Other long term (current) drug therapy: Secondary | ICD-10-CM | POA: Diagnosis not present

## 2012-12-10 DIAGNOSIS — R079 Chest pain, unspecified: Secondary | ICD-10-CM | POA: Diagnosis not present

## 2012-12-10 DIAGNOSIS — M7989 Other specified soft tissue disorders: Secondary | ICD-10-CM | POA: Diagnosis not present

## 2012-12-10 DIAGNOSIS — Z8639 Personal history of other endocrine, nutritional and metabolic disease: Secondary | ICD-10-CM | POA: Insufficient documentation

## 2012-12-10 DIAGNOSIS — G8929 Other chronic pain: Secondary | ICD-10-CM | POA: Insufficient documentation

## 2012-12-10 HISTORY — DX: Unspecified osteoarthritis, unspecified site: M19.90

## 2012-12-10 LAB — CBC
Hemoglobin: 12.4 g/dL (ref 12.0–15.0)
MCH: 30.1 pg (ref 26.0–34.0)
MCV: 88.3 fL (ref 78.0–100.0)
Platelets: 179 10*3/uL (ref 150–400)
RBC: 4.12 MIL/uL (ref 3.87–5.11)

## 2012-12-10 LAB — BASIC METABOLIC PANEL
BUN: 24 mg/dL — ABNORMAL HIGH (ref 6–23)
CO2: 27 mEq/L (ref 19–32)
Calcium: 9.4 mg/dL (ref 8.4–10.5)
Creatinine, Ser: 0.93 mg/dL (ref 0.50–1.10)
Glucose, Bld: 80 mg/dL (ref 70–99)

## 2012-12-10 MED ORDER — ASPIRIN 81 MG PO CHEW
324.0000 mg | CHEWABLE_TABLET | Freq: Once | ORAL | Status: AC
  Administered 2012-12-10: 324 mg via ORAL
  Filled 2012-12-10: qty 4

## 2012-12-10 MED ORDER — NITROGLYCERIN 0.4 MG SL SUBL: 0.4000 mg | SUBLINGUAL_TABLET | Freq: Once | SUBLINGUAL | Status: DC

## 2012-12-10 MED ORDER — NITROGLYCERIN 0.2 MG/HR TD PT24
0.2000 mg | MEDICATED_PATCH | Freq: Every day | TRANSDERMAL | Status: DC
  Administered 2012-12-10: 0.2 mg via TRANSDERMAL
  Filled 2012-12-10: qty 1

## 2012-12-10 NOTE — ED Notes (Signed)
Pt diaper changed, was saturated.  Pt repositioned for comfort, denies any discomfort at present.

## 2012-12-10 NOTE — ED Notes (Signed)
Pharmacy at bedside- pt states that she normally wears nitro patch 0.2mg  but took it off 2 days ago because her veins were big. MD made aware.

## 2012-12-10 NOTE — ED Provider Notes (Addendum)
History     CSN: [id number]  Arrival date & time 12/10/12  0719   First MD Initiated Contact with Patient 12/10/12 [phone]      Chief Complaint  Patient presents with  . Chest Pain    (Consider location/radiation/quality/duration/timing/severity/associated sxs/prior treatment) Patient is a 79 y.o. female presenting with chest pain. The history is provided by the patient. No language interpreter was used.  Chest Pain Pain location:  L chest and L lateral chest Pain quality: aching, dull and shooting   Pain radiates to:  Neck, L shoulder and lower back (separate areas of pain "I think it is probably just arthritis") Pain radiates to the back: no   Pain severity:  Mild Onset quality:  Unable to specify Duration:  2 days Timing:  Intermittent Progression:  Waxing and waning Chronicity:  Chronic Context: movement, raising an arm and at rest   Context: not breathing, no drug use and no trauma   Relieved by:  Nothing Worsened by:  Nothing tried Associated symptoms: back pain, fatigue and lower extremity edema   Associated symptoms: no abdominal pain, no altered mental status, no anorexia, no cough, no diaphoresis, no dizziness, no dysphagia, no fever, no nausea, no orthopnea, no palpitations, no shortness of breath, no syncope, not vomiting and no weakness   Risk factors: coronary artery disease and hypertension     Past Medical History  Diagnosis Date  . Hypertension   . Gout   . Thyroid disease   . Coronary artery disease   . Arthritis     Past Surgical History  Procedure Laterality Date  . Abdominal hysterectomy    . Appendectomy      No family history on file.  History  Substance Use Topics  . Smoking status: Never Smoker   . Smokeless tobacco: Not on file  . Alcohol Use: No    OB History   Grav Para Term Preterm Abortions TAB SAB Ect Mult Living                  Review of Systems  Constitutional: Positive for fatigue. Negative for fever, diaphoresis,  activity change and appetite change.  HENT: Positive for neck pain. Negative for congestion, sore throat, rhinorrhea, drooling, trouble swallowing and neck stiffness.   Eyes: Negative for visual disturbance.  Respiratory: Negative for cough, chest tightness and shortness of breath.   Cardiovascular: Positive for chest pain and leg swelling (chronic). Negative for palpitations, orthopnea and syncope.  Gastrointestinal: Negative for nausea, vomiting, abdominal pain, diarrhea and anorexia.  Genitourinary: Negative for dysuria.  Musculoskeletal: Positive for back pain and arthralgias. Negative for myalgias, joint swelling and gait problem.  Skin: Negative for rash and wound.  Neurological: Negative for dizziness, syncope, facial asymmetry, speech difficulty, weakness and light-headedness.  Psychiatric/Behavioral: Negative for confusion and altered mental status.    Allergies  Crestor  Home Medications   Current Outpatient Rx  Name  Route  Sig  Dispense  Refill  . acetaminophen (TYLENOL) 325 MG tablet   Oral   Take 650 mg by mouth every 6 (six) hours as needed. For pain         . amLODipine-olmesartan (AZOR) 5-20 MG per tablet   Oral   Take 1 tablet by mouth daily.         Marland Kitchen aspirin 81 MG chewable tablet   Oral   Chew 81 mg by mouth daily.         . Cholecalciferol (VITAMIN D PO)   Oral  Take 1 tablet by mouth daily.         . furosemide (LASIX) 20 MG tablet   Oral   Take 20 mg by mouth daily.         Marland Kitchen levothyroxine (SYNTHROID, LEVOTHROID) 75 MCG tablet   Oral   Take 75 mcg by mouth daily.           BP 160/61  Pulse 64  Temp(Src) 97.9 F (36.6 C) (Oral)  Resp 17  SpO2 100%  Physical Exam  Nursing note and vitals reviewed. Constitutional: She is oriented to person, place, and time. She appears well-developed and well-nourished. No distress.  HENT:  Head: Normocephalic and atraumatic.  Right Ear: External ear normal.  Left Ear: External ear normal.   Nose: Nose normal.  Mouth/Throat: Oropharynx is clear and moist.  Eyes: Conjunctivae and EOM are normal. Pupils are equal, round, and reactive to light. Right eye exhibits no discharge. Left eye exhibits no discharge. No scleral icterus.  Neck: Normal range of motion. Neck supple. JVD (very mild) present. Muscular tenderness present. No spinous process tenderness present. Carotid bruit is not present. No rigidity. No tracheal deviation, no edema, no erythema and normal range of motion present.  Cardiovascular: Normal rate, regular rhythm, intact distal pulses and normal pulses.   No extrasystoles are present. PMI is not displaced.  Exam reveals no gallop and no decreased pulses.   Murmur heard. Pulmonary/Chest: Effort normal and breath sounds normal. No stridor. No respiratory distress. She has no wheezes. She has no rales. She exhibits no tenderness.  Abdominal: Soft. Bowel sounds are normal. She exhibits no distension and no mass. There is no tenderness. There is no rebound and no guarding.  Musculoskeletal: Normal range of motion. She exhibits edema and tenderness (diffuse neck).  Lymphadenopathy:    She has no cervical adenopathy.  Neurological: She is alert and oriented to person, place, and time. No cranial nerve deficit. She exhibits normal muscle tone.  Skin: Skin is warm and dry. No rash noted. She is not diaphoretic. No erythema. No pallor.  Psychiatric: She has a normal mood and affect. Her behavior is normal.    ED Course  Procedures (including critical care time)  Labs Reviewed  CBC  BASIC METABOLIC PANEL   No results found.   No diagnosis found.   Date: 12/10/2012 @ 0732  Rate: 66 bpm  Rhythm: sinus  QRS Axis: normal  Intervals: normal  ST/T Wave abnormalities: normal  Conduction Disutrbances:none  Narrative Interpretation:   Old EKG Reviewed: unchanged      MDM  Pt presents for evaluation of chest pain and pain at various other body sites.  She appears  nontoxic, note mildly elevated BP, NAD.  There is no evidence of acute ischemia on the EKG.  Plan symptomatic care while awaiting the results of routine labs, trop, and CXR.  If the initial trop is negative, will obtain a 3 hr trop and contact her PMD or cardiologist for further recommendations.  0800.  Nursing obtained further hx.  She has a hx of chronic chest pain and had been prescribed a nitro-patch.  She stopped using the patch 3 days ago because she thought her veins appeared "too large".    1330.  P stable, NAD.  She is pain free.  Trop negative x2.  Plan d/c home.  Pt has been advised to follow-up with her PMD and to use her nitro-patch.       Tobin Chad, MD 12/10/12 1339  Tobin Chad, MD 12/10/12 1339

## 2012-12-10 NOTE — ED Notes (Signed)
Per ems- pt from home reports multiple sites of pain- chest, shoulder, neck pain for past 24 hours. Reason for call was for cp. Pain has since resolved on own. 12 lead unremarkable. bp- 150/70 r-14 hr-70 o2 100%. Pt in nad at this time CA&Ox3.

## 2012-12-10 NOTE — ED Notes (Signed)
Lab at bedside

## 2012-12-10 NOTE — Discharge Instructions (Signed)
Chest Pain (Nonspecific) It is often hard to give a specific diagnosis for the cause of chest pain. There is always a chance that your pain could be related to something serious, such as a heart attack or a blood clot in the lungs. You need to follow up with your caregiver for further evaluation. CAUSES   Heartburn.  Pneumonia or bronchitis.  Anxiety or stress.  Inflammation around your heart (pericarditis) or lung (pleuritis or pleurisy).  A blood clot in the lung.  A collapsed lung (pneumothorax). It can develop suddenly on its own (spontaneous pneumothorax) or from injury (trauma) to the chest.  Shingles infection (herpes zoster virus). The chest wall is composed of bones, muscles, and cartilage. Any of these can be the source of the pain.  The bones can be bruised by injury.  The muscles or cartilage can be strained by coughing or overwork.  The cartilage can be affected by inflammation and become sore (costochondritis). DIAGNOSIS  Lab tests or other studies, such as X-rays, electrocardiography, stress testing, or cardiac imaging, may be needed to find the cause of your pain.  TREATMENT   Treatment depends on what may be causing your chest pain. Treatment may include:  Acid blockers for heartburn.  Anti-inflammatory medicine.  Pain medicine for inflammatory conditions.  Antibiotics if an infection is present.  You may be advised to change lifestyle habits. This includes stopping smoking and avoiding alcohol, caffeine, and chocolate.  You may be advised to keep your head raised (elevated) when sleeping. This reduces the chance of acid going backward from your stomach into your esophagus.  Most of the time, nonspecific chest pain will improve within 2 to 3 days with rest and mild pain medicine. HOME CARE INSTRUCTIONS   If antibiotics were prescribed, take your antibiotics as directed. Finish them even if you start to feel better.  For the next few days, avoid physical  activities that bring on chest pain. Continue physical activities as directed.  Do not smoke.  Avoid drinking alcohol.  Only take over-the-counter or prescription medicine for pain, discomfort, or fever as directed by your caregiver.  Follow your caregiver's suggestions for further testing if your chest pain does not go away.  Keep any follow-up appointments you made. If you do not go to an appointment, you could develop lasting (chronic) problems with pain. If there is any problem keeping an appointment, you must call to reschedule. SEEK MEDICAL CARE IF:   You think you are having problems from the medicine you are taking. Read your medicine instructions carefully.  Your chest pain does not go away, even after treatment.  You develop a rash with blisters on your chest. SEEK IMMEDIATE MEDICAL CARE IF:   You have increased chest pain or pain that spreads to your arm, neck, jaw, back, or abdomen.  You develop shortness of breath, an increasing cough, or you are coughing up blood.  You have severe back or abdominal pain, feel nauseous, or vomit.  You develop severe weakness, fainting, or chills.  You have a fever. THIS IS AN EMERGENCY. Do not wait to see if the pain will go away. Get medical help at once. Call your local emergency services (911 in [place] Do not drive yourself to the hospital. MAKE SURE YOU:   Understand these instructions.  Will watch your condition.  Will get help right away if you are not doing well or get worse. Document Released: 06/26/2005 Document Revised: 12/09/2011 Document Reviewed: 04/21/2008 [facility] Patient Information 2013 [employer].  Musculoskeletal Pain Musculoskeletal pain is muscle and boney aches and pains. These pains can occur in any part of the body. Your caregiver may treat you without knowing the cause of the pain. They may treat you if blood or urine tests, X-rays, and other tests were normal.  CAUSES There is often not a definite  cause or reason for these pains. These pains may be caused by a type of germ (virus). The discomfort may also come from overuse. Overuse includes working out too hard when your body is not fit. Boney aches also come from weather changes. Bone is sensitive to atmospheric pressure changes. HOME CARE INSTRUCTIONS   Ask when your test results will be ready. Make sure you get your test results.  Only take over-the-counter or prescription medicines for pain, discomfort, or fever as directed by your caregiver. If you were given medications for your condition, do not drive, operate machinery or power tools, or sign legal documents for 24 hours. Do not drink alcohol. Do not take sleeping pills or other medications that may interfere with treatment.  Continue all activities unless the activities cause more pain. When the pain lessens, slowly resume normal activities. Gradually increase the intensity and duration of the activities or exercise.  During periods of severe pain, bed rest may be helpful. Lay or sit in any position that is comfortable.  Putting ice on the injured area.  Put ice in a bag.  Place a towel between your skin and the bag.  Leave the ice on for 15 to 20 minutes, 3 to 4 times a day.  Follow up with your caregiver for continued problems and no reason can be found for the pain. If the pain becomes worse or does not go away, it may be necessary to repeat tests or do additional testing. Your caregiver may need to look further for a possible cause. SEEK IMMEDIATE MEDICAL CARE IF:  You have pain that is getting worse and is not relieved by medications.  You develop chest pain that is associated with shortness or breath, sweating, feeling sick to your stomach (nauseous), or throw up (vomit).  Your pain becomes localized to the abdomen.  You develop any new symptoms that seem different or that concern you. MAKE SURE YOU:   Understand these instructions.  Will watch your  condition.  Will get help right away if you are not doing well or get worse. Document Released: 09/16/2005 Document Revised: 12/09/2011 Document Reviewed: 05/06/2008 [facility] Patient Information 2013 [place], [place].

## 2012-12-17 DIAGNOSIS — E039 Hypothyroidism, unspecified: Secondary | ICD-10-CM | POA: Diagnosis not present

## 2012-12-17 DIAGNOSIS — I10 Essential (primary) hypertension: Secondary | ICD-10-CM | POA: Diagnosis not present

## 2012-12-17 DIAGNOSIS — D649 Anemia, unspecified: Secondary | ICD-10-CM | POA: Diagnosis not present

## 2012-12-17 DIAGNOSIS — R079 Chest pain, unspecified: Secondary | ICD-10-CM | POA: Diagnosis not present

## 2012-12-17 DIAGNOSIS — E559 Vitamin D deficiency, unspecified: Secondary | ICD-10-CM | POA: Diagnosis not present

## 2013-01-05 DIAGNOSIS — E039 Hypothyroidism, unspecified: Secondary | ICD-10-CM | POA: Diagnosis not present

## 2013-01-05 DIAGNOSIS — E559 Vitamin D deficiency, unspecified: Secondary | ICD-10-CM | POA: Diagnosis not present

## 2013-01-05 DIAGNOSIS — M2550 Pain in unspecified joint: Secondary | ICD-10-CM | POA: Diagnosis not present

## 2013-01-05 DIAGNOSIS — I10 Essential (primary) hypertension: Secondary | ICD-10-CM | POA: Diagnosis not present

## 2013-01-20 DIAGNOSIS — R079 Chest pain, unspecified: Secondary | ICD-10-CM | POA: Diagnosis not present

## 2013-01-20 DIAGNOSIS — E039 Hypothyroidism, unspecified: Secondary | ICD-10-CM | POA: Diagnosis not present

## 2013-01-20 DIAGNOSIS — E559 Vitamin D deficiency, unspecified: Secondary | ICD-10-CM | POA: Diagnosis not present

## 2013-01-20 DIAGNOSIS — M2550 Pain in unspecified joint: Secondary | ICD-10-CM | POA: Diagnosis not present

## 2013-02-04 DIAGNOSIS — B351 Tinea unguium: Secondary | ICD-10-CM | POA: Diagnosis not present

## 2013-02-04 DIAGNOSIS — M79609 Pain in unspecified limb: Secondary | ICD-10-CM | POA: Diagnosis not present

## 2013-03-10 ENCOUNTER — Encounter (HOSPITAL_COMMUNITY): Payer: Self-pay | Admitting: Emergency Medicine

## 2013-03-10 ENCOUNTER — Emergency Department (INDEPENDENT_AMBULATORY_CARE_PROVIDER_SITE_OTHER)
Admission: EM | Admit: 2013-03-10 | Discharge: 2013-03-10 | Disposition: A | Discharge status: Home / Self Care | Payer: Medicare Other | Source: Home / Self Care | Attending: Emergency Medicine | Admitting: Emergency Medicine

## 2013-03-10 DIAGNOSIS — R609 Edema, unspecified: Secondary | ICD-10-CM

## 2013-03-10 DIAGNOSIS — R600 Localized edema: Secondary | ICD-10-CM

## 2013-03-10 HISTORY — DX: Heart failure, unspecified: I50.9

## 2013-03-10 LAB — POCT I-STAT, CHEM 8
Chloride: 103 mEq/L (ref 96–112)
HCT: 40 % (ref 36.0–46.0)
Potassium: 3.8 mEq/L (ref 3.5–5.1)
Sodium: 139 mEq/L (ref 135–145)

## 2013-03-10 LAB — D-DIMER, QUANTITATIVE: D-Dimer, Quant: 0.95 ug/mL-FEU — ABNORMAL HIGH (ref 0.00–0.48)

## 2013-03-10 LAB — LIPASE, BLOOD: Lipase: 28 U/L (ref 11–59)

## 2013-03-10 MED ORDER — ENOXAPARIN SODIUM 80 MG/0.8ML ~~LOC~~ SOLN
1.0000 mg/kg | Freq: Once | SUBCUTANEOUS | Status: AC
  Administered 2013-03-10: 65 mg via SUBCUTANEOUS

## 2013-03-10 NOTE — ED Notes (Signed)
Notified pharmacy or lovenox order, phoned, faxed copy of order and sent copy with medical records personal to pick up medicine.

## 2013-03-10 NOTE — ED Provider Notes (Signed)
Chief Complaint:   Chief Complaint  Patient presents with  . Leg Swelling    History of Present Illness:   [name] is a 79 year old female who presents today with swelling of her legs, feet, ankles, knees, and pain in the knees and ankles. This is been going on for years. The patient states she's been in the hospital several times for this and see her primary care physician in the pain is worse if she walks. She takes furosemide 20 mg per day. Her primary care physician is Dr. Smith Mince. She last saw him in April and plans to see him again next week. She states the swelling in the legs he has been worse the past 4 days. She denies any fever, chills, shortness of breath, PND, orthopnea. She does have a history of coronary artery disease and congestive heart failure. She denies any chest pain, pressure, or tightness. There is no swelling of her face, hands, or abdomen. She denies any abdominal pain. She denies any history of kidney disease. She has had no history of thrombophlebitis, blood clots, or pulmonary embolism.  Review of Systems:  Other than noted above, the patient denies any of the following symptoms: Systemic:  No fever, chills, sweats, weight gain or loss. Respiratory:  No coughing, wheezing, or shortness of breath. Cardiac:  No chest pain, tightness, pressure or syncope. GI:  No abdominal pain, swelling, distension, nausea, or vomiting. GU:  No dysuria, frequency, or hematuria. Ext:  No joint pain, muscle pain, or weakness. Skin:  No rash or itching. Neuro:  No paresthesias.  PMFSH:  Past medical history, family history, social history, meds, and allergies were reviewed.  She has high blood pressure, gout, thyroid disease, coronary artery disease, arthritis, and congestive heart failure and she takes a sore, aspirin, vitamin D, furosemide, Vicodin, Synthroid, Toprol, nitroglycerin, and triamcinolone cream.  Physical Exam:   Vital signs:  BP 180/75  Pulse 111  Temp(Src) 98.8  F (37.1 C) (Oral)  Resp 15  Ht 5\' 5"  (1.651 m)  Wt 145 lb (65.772 kg)  BMI 24.13 kg/m2  SpO2 97% Gen:  Alert, oriented, in no distress. Neck:  No tenderness, adenopathy, or JVD. Lungs:  Breath sounds clear and equal bilaterally.  No rales, rhonchi or wheezes. Heart:  Regular rhythm, no gallops or murmers. Abdomen:  Soft, nontender, no organomegaly or mass. Ext:  She has pitting edema of her feet and ankles extending up to the knees. There no ulcerations no weeping. She has mild calf tenderness. Homans sign is negative. There is a limited range of motion of both ankles with pain on movement. Her knees have a limited range of motion with pain on movement as well. Neuro:  Alert and oriented times 3.  No muscle weakness.  Sensation intact to light touch. Skin:  Warm and dry.  No rash or skin lesions.  Labs:   Results for orders placed during the hospital encounter of 03/10/13  LIPASE, BLOOD      Result Value Range   Lipase 28  11 - 59 U/L  D-DIMER, QUANTITATIVE      Result Value Range   D-Dimer, Quant 0.95 (*) 0.00 - 0.48 ug/mL-FEU  POCT I-STAT, CHEM 8      Result Value Range   Sodium 139  135 - 145 mEq/L   Potassium 3.8  3.5 - 5.1 mEq/L   Chloride 103  96 - 112 mEq/L   BUN 29 (*) 6 - 23 mg/dL   Creatinine, Ser 8.65  0.50 - 1.10 mg/dL   Glucose, Bld 93  70 - 99 mg/dL   Calcium, Ion 1.19  1.47 - 1.30 mmol/L   TCO2 30  0 - 100 mmol/L   Hemoglobin 13.6  12.0 - 15.0 g/dL   HCT 82.9  56.2 - 13.0 %    Course in Urgent Care Center:  She was given Lovenox 1 mg per kilogram and a single subcutaneous dose. She was scheduled for a venous Doppler tomorrow morning on both legs. Results to be sent to Dr. Smith Mince.  Assessment:  The encounter diagnosis was Leg edema.  Differential diagnosis includes venous stasis, congestive heart failure, or DVT. With elevated d-dimer, DVT needs to be ruled out. If her venous Doppler turns out normal, increasing her diuretic might be helpful.  Plan:   1.   The following meds were prescribed:   Discharge Medication List as of 03/10/2013  4:43 PM     2.  The patient was instructed in symptomatic care and handouts were given. 3.  The patient was told to return if becoming worse in any way, if no better in 3 or 4 days, and given some red flag symptoms such as shortness of breath or chest pain that would indicate earlier return. 4.  Follow up with Dr. Smith Mince.    Reuben Likes, MD 03/10/13 2033

## 2013-03-10 NOTE — ED Notes (Signed)
Patient has ankle swelling that she has had for one year.  Patient reports swelling worse today, this has been the case for 2-3 weeks.  Patient did discuss this with her pcp.  Patient concerned for blood clot-has NOT had a blood clot in the past.  Her sisters have had a blood clot.  Denies sob.  Denies chest pain

## 2013-03-11 ENCOUNTER — Ambulatory Visit (HOSPITAL_COMMUNITY)
Admission: RE | Admit: 2013-03-11 | Discharge: 2013-03-11 | Disposition: A | Discharge status: Home / Self Care | Payer: Medicare Other | Source: Ambulatory Visit | Attending: Emergency Medicine | Admitting: Emergency Medicine

## 2013-03-11 DIAGNOSIS — M7989 Other specified soft tissue disorders: Secondary | ICD-10-CM | POA: Diagnosis not present

## 2013-03-11 NOTE — Progress Notes (Signed)
VASCULAR LAB PRELIMINARY  PRELIMINARY  PRELIMINARY  PRELIMINARY  Bilateral lower extremity venous duplex  completed.    Preliminary report:  Bilateral:  No evidence of DVT, superficial thrombosis, or Baker's Cyst.    [place] Latulippe, RVT 03/11/2013, 12:12 PM

## 2013-03-22 DIAGNOSIS — M2550 Pain in unspecified joint: Secondary | ICD-10-CM | POA: Diagnosis not present

## 2013-03-22 DIAGNOSIS — I10 Essential (primary) hypertension: Secondary | ICD-10-CM | POA: Diagnosis not present

## 2013-03-22 DIAGNOSIS — E039 Hypothyroidism, unspecified: Secondary | ICD-10-CM | POA: Diagnosis not present

## 2013-03-22 DIAGNOSIS — E559 Vitamin D deficiency, unspecified: Secondary | ICD-10-CM | POA: Diagnosis not present

## 2013-03-24 DIAGNOSIS — E039 Hypothyroidism, unspecified: Secondary | ICD-10-CM | POA: Diagnosis not present

## 2013-03-24 DIAGNOSIS — M2550 Pain in unspecified joint: Secondary | ICD-10-CM | POA: Diagnosis not present

## 2013-03-24 DIAGNOSIS — R42 Dizziness and giddiness: Secondary | ICD-10-CM | POA: Diagnosis not present

## 2013-03-24 DIAGNOSIS — E559 Vitamin D deficiency, unspecified: Secondary | ICD-10-CM | POA: Diagnosis not present

## 2013-04-21 ENCOUNTER — Encounter (HOSPITAL_COMMUNITY): Payer: Self-pay

## 2013-04-21 ENCOUNTER — Emergency Department (HOSPITAL_COMMUNITY): Payer: Medicare Other

## 2013-04-21 ENCOUNTER — Observation Stay (HOSPITAL_COMMUNITY)
Admission: EM | Admit: 2013-04-21 | Discharge: 2013-04-22 | Disposition: A | Discharge status: Home / Self Care | Payer: Medicare Other | Attending: Cardiovascular Disease | Admitting: Cardiovascular Disease

## 2013-04-21 DIAGNOSIS — I509 Heart failure, unspecified: Secondary | ICD-10-CM | POA: Insufficient documentation

## 2013-04-21 DIAGNOSIS — I10 Essential (primary) hypertension: Secondary | ICD-10-CM | POA: Diagnosis not present

## 2013-04-21 DIAGNOSIS — R0602 Shortness of breath: Secondary | ICD-10-CM | POA: Diagnosis not present

## 2013-04-21 DIAGNOSIS — Z79899 Other long term (current) drug therapy: Secondary | ICD-10-CM | POA: Diagnosis not present

## 2013-04-21 DIAGNOSIS — R002 Palpitations: Secondary | ICD-10-CM | POA: Diagnosis not present

## 2013-04-21 DIAGNOSIS — R262 Difficulty in walking, not elsewhere classified: Secondary | ICD-10-CM | POA: Insufficient documentation

## 2013-04-21 DIAGNOSIS — R609 Edema, unspecified: Principal | ICD-10-CM | POA: Insufficient documentation

## 2013-04-21 DIAGNOSIS — R0600 Dyspnea, unspecified: Secondary | ICD-10-CM

## 2013-04-21 DIAGNOSIS — E559 Vitamin D deficiency, unspecified: Secondary | ICD-10-CM | POA: Diagnosis not present

## 2013-04-21 DIAGNOSIS — R5381 Other malaise: Secondary | ICD-10-CM | POA: Diagnosis not present

## 2013-04-21 DIAGNOSIS — E039 Hypothyroidism, unspecified: Secondary | ICD-10-CM | POA: Diagnosis not present

## 2013-04-21 DIAGNOSIS — I359 Nonrheumatic aortic valve disorder, unspecified: Secondary | ICD-10-CM | POA: Diagnosis not present

## 2013-04-21 DIAGNOSIS — M2550 Pain in unspecified joint: Secondary | ICD-10-CM | POA: Diagnosis not present

## 2013-04-21 DIAGNOSIS — F411 Generalized anxiety disorder: Secondary | ICD-10-CM | POA: Diagnosis not present

## 2013-04-21 DIAGNOSIS — Z7982 Long term (current) use of aspirin: Secondary | ICD-10-CM | POA: Diagnosis not present

## 2013-04-21 DIAGNOSIS — K449 Diaphragmatic hernia without obstruction or gangrene: Secondary | ICD-10-CM | POA: Insufficient documentation

## 2013-04-21 HISTORY — DX: Shortness of breath: R06.02

## 2013-04-21 HISTORY — DX: Hypothyroidism, unspecified: E03.9

## 2013-04-21 LAB — BASIC METABOLIC PANEL
BUN: 22 mg/dL (ref 6–23)
CO2: 28 mEq/L (ref 19–32)
Calcium: 9.2 mg/dL (ref 8.4–10.5)
Chloride: 103 mEq/L (ref 96–112)
Creatinine, Ser: 1.02 mg/dL (ref 0.50–1.10)
GFR calc Af Amer: 52 mL/min — ABNORMAL LOW (ref >90)
GFR calc non Af Amer: 45 mL/min — ABNORMAL LOW (ref >90)
Glucose, Bld: 88 mg/dL (ref 70–99)
Potassium: 4.3 mEq/L (ref 3.5–5.1)
Sodium: 139 mEq/L (ref 135–145)

## 2013-04-21 LAB — CBC WITH DIFFERENTIAL/PLATELET
Basophils Absolute: 0 10*3/uL (ref 0.0–0.1)
Basophils Relative: 0 % (ref 0–1)
Eosinophils Absolute: 0.1 10*3/uL (ref 0.0–0.7)
Eosinophils Relative: 2 % (ref 0–5)
HCT: 36.6 % (ref 36.0–46.0)
Hemoglobin: 12.5 g/dL (ref 12.0–15.0)
Lymphocytes Relative: 39 % (ref 12–46)
Lymphs Abs: 1.3 10*3/uL (ref 0.7–4.0)
MCH: 31.5 pg (ref 26.0–34.0)
MCHC: 34.2 g/dL (ref 30.0–36.0)
MCV: 92.2 fL (ref 78.0–100.0)
Monocytes Absolute: 0.3 10*3/uL (ref 0.1–1.0)
Monocytes Relative: 8 % (ref 3–12)
Neutro Abs: 1.7 10*3/uL (ref 1.7–7.7)
Neutrophils Relative %: 51 % (ref 43–77)
Platelets: 198 10*3/uL (ref 150–400)
RBC: 3.97 MIL/uL (ref 3.87–5.11)
RDW: 14 % (ref 11.5–15.5)
WBC: 3.4 10*3/uL — ABNORMAL LOW (ref 4.0–10.5)

## 2013-04-21 LAB — CBC
Hemoglobin: 13 g/dL (ref 12.0–15.0)
MCH: 31 pg (ref 26.0–34.0)
MCHC: 33.8 g/dL (ref 30.0–36.0)
Platelets: 213 10*3/uL (ref 150–400)

## 2013-04-21 LAB — PRO B NATRIURETIC PEPTIDE: Pro B Natriuretic peptide (BNP): 415.4 pg/mL (ref 0–450)

## 2013-04-21 LAB — CREATININE, SERUM: Creatinine, Ser: 1.04 mg/dL (ref 0.50–1.10)

## 2013-04-21 LAB — TROPONIN I: Troponin I: <0.3 ng/mL (ref <0.30)

## 2013-04-21 MED ORDER — AMLODIPINE BESYLATE 2.5 MG PO TABS
2.5000 mg | ORAL_TABLET | Freq: Every day | ORAL | Status: DC
  Administered 2013-04-22: 2.5 mg via ORAL
  Filled 2013-04-21: qty 1

## 2013-04-21 MED ORDER — FUROSEMIDE 10 MG/ML IJ SOLN
40.0000 mg | Freq: Once | INTRAMUSCULAR | Status: AC
  Administered 2013-04-21: 40 mg via INTRAVENOUS
  Filled 2013-04-21: qty 4

## 2013-04-21 MED ORDER — SODIUM CHLORIDE 0.9 % IJ SOLN: 3.0000 mL | Freq: Two times a day (BID) | INTRAMUSCULAR | Status: DC

## 2013-04-21 MED ORDER — ASPIRIN 81 MG PO CHEW
81.0000 mg | CHEWABLE_TABLET | Freq: Every day | ORAL | Status: DC
  Administered 2013-04-21 – 2013-04-22 (×2): 81 mg via ORAL
  Filled 2013-04-21 (×2): qty 1

## 2013-04-21 MED ORDER — SODIUM CHLORIDE 0.9 % IJ SOLN: 3.0000 mL | INTRAMUSCULAR | Status: DC | PRN

## 2013-04-21 MED ORDER — SODIUM CHLORIDE 0.9 % IJ SOLN
3.0000 mL | Freq: Two times a day (BID) | INTRAMUSCULAR | Status: DC
  Administered 2013-04-21 – 2013-04-22 (×2): 3 mL via INTRAVENOUS

## 2013-04-21 MED ORDER — LOSARTAN POTASSIUM 50 MG PO TABS
50.0000 mg | ORAL_TABLET | Freq: Every day | ORAL | Status: DC
  Administered 2013-04-22: 50 mg via ORAL
  Filled 2013-04-21: qty 1

## 2013-04-21 MED ORDER — NITROGLYCERIN 0.2 MG/HR TD PT24
0.2000 mg | MEDICATED_PATCH | Freq: Every day | TRANSDERMAL | Status: DC
  Administered 2013-04-22: 0.2 mg via TRANSDERMAL
  Filled 2013-04-21: qty 1

## 2013-04-21 MED ORDER — ALUM & MAG HYDROXIDE-SIMETH 200-200-20 MG/5ML PO SUSP: 30.0000 mL | Freq: Four times a day (QID) | ORAL | Status: DC | PRN

## 2013-04-21 MED ORDER — ONDANSETRON HCL 4 MG/2ML IJ SOLN: 4.0000 mg | Freq: Four times a day (QID) | INTRAMUSCULAR | Status: DC | PRN

## 2013-04-21 MED ORDER — HEPARIN SODIUM (PORCINE) 5000 UNIT/ML IJ SOLN
5000.0000 [IU] | Freq: Three times a day (TID) | INTRAMUSCULAR | Status: DC
  Administered 2013-04-21 – 2013-04-22 (×3): 5000 [IU] via SUBCUTANEOUS
  Filled 2013-04-21 (×5): qty 1

## 2013-04-21 MED ORDER — DOCUSATE SODIUM 100 MG PO CAPS
100.0000 mg | ORAL_CAPSULE | Freq: Two times a day (BID) | ORAL | Status: DC
  Administered 2013-04-21 – 2013-04-22 (×2): 100 mg via ORAL
  Filled 2013-04-21 (×3): qty 1

## 2013-04-21 MED ORDER — ONDANSETRON HCL 4 MG PO TABS: 4.0000 mg | ORAL_TABLET | Freq: Four times a day (QID) | ORAL | Status: DC | PRN

## 2013-04-21 MED ORDER — SODIUM CHLORIDE 0.9 % IV SOLN: 250.0000 mL | INTRAVENOUS | Status: DC | PRN

## 2013-04-21 MED ORDER — LEVOTHYROXINE SODIUM 75 MCG PO TABS
75.0000 ug | ORAL_TABLET | Freq: Every day | ORAL | Status: DC
  Administered 2013-04-22: 75 ug via ORAL
  Filled 2013-04-21 (×2): qty 1

## 2013-04-21 NOTE — H&P (Signed)
[name] is an 79 y.o. female.   Chief Complaint: Leg edema HPI: 79 years old female with increasing leg edema without chest pain or shortness of breath. She has large hiatal hernia. No fever or cough.  Past Medical History  Diagnosis Date  . Hypertension   . Gout   . Thyroid disease   . Coronary artery disease   . Arthritis   . CHF (congestive heart failure)       Past Surgical History  Procedure Laterality Date  . Abdominal hysterectomy    . Appendectomy      No family history on file. Social History:  reports that she has never smoked. She does not have any smokeless tobacco history on file. She reports that she does not drink alcohol or use illicit drugs.  Allergies:  Allergies  Allergen Reactions  . Crestor (Rosuvastatin) Nausea Only     (Not in a hospital admission)  Results for orders placed during the hospital encounter of 04/21/13 (from the past 48 hour(s))  CBC WITH DIFFERENTIAL     Status: Abnormal   Collection Time    04/21/13  3:04 PM      Result Value Range   WBC 3.4 (*) 4.0 - 10.5 K/uL   RBC 3.97  3.87 - 5.11 MIL/uL   Hemoglobin 12.5  12.0 - 15.0 g/dL   HCT 11.9  14.7 - 82.9 %   MCV 92.2  78.0 - 100.0 fL   MCH 31.5  26.0 - 34.0 pg   MCHC 34.2  30.0 - 36.0 g/dL   RDW 56.2  13.0 - 86.5 %   Platelets 198  150 - 400 K/uL   Neutrophils Relative % 51  43 - 77 %   Neutro Abs 1.7  1.7 - 7.7 K/uL   Lymphocytes Relative 39  12 - 46 %   Lymphs Abs 1.3  0.7 - 4.0 K/uL   Monocytes Relative 8  3 - 12 %   Monocytes Absolute 0.3  0.1 - 1.0 K/uL   Eosinophils Relative 2  0 - 5 %   Eosinophils Absolute 0.1  0.0 - 0.7 K/uL   Basophils Relative 0  0 - 1 %   Basophils Absolute 0.0  0.0 - 0.1 K/uL  BASIC METABOLIC PANEL     Status: Abnormal   Collection Time    04/21/13  3:04 PM      Result Value Range   Sodium 139  135 - 145 mEq/L   Potassium 4.3  3.5 - 5.1 mEq/L   Chloride 103  96 - 112 mEq/L   CO2 28  19 - 32 mEq/L   Glucose, Bld 88  70 - 99 mg/dL    BUN 22  6 - 23 mg/dL   Creatinine, Ser 7.84  0.50 - 1.10 mg/dL   Calcium 9.2  8.4 - 69.6 mg/dL   GFR calc non Af Amer 45 (*) >90 mL/min   GFR calc Af Amer 52 (*) >90 mL/min   Comment:            The eGFR has been calculated     using the CKD EPI equation.     This calculation has not been     validated in all clinical     situations.     eGFR's persistently     <90 mL/min signify     possible Chronic Kidney Disease.  TROPONIN I     Status: None   Collection Time    04/21/13  3:04 PM      Result Value Range   Troponin I <0.30  <0.30 ng/mL   Comment:            Due to the release kinetics of cTnI,     a negative result within the first hours     of the onset of symptoms does not rule out     myocardial infarction with certainty.     If myocardial infarction is still suspected,     repeat the test at appropriate intervals.  PRO B NATRIURETIC PEPTIDE     Status: None   Collection Time    04/21/13  3:04 PM      Result Value Range   Pro B Natriuretic peptide (BNP) 415.4  0 - 450 pg/mL   Dg Chest 2 View  04/21/2013   *RADIOLOGY REPORT*  Clinical Data: Shortness of breath with bilateral knee swelling. History of hypertension.  CHEST - 2 VIEW  Comparison: 12/10/2012.  Findings: Cardiomegaly and a moderate sized hiatal hernia appear unchanged.  The lungs are clear.  There is no pleural effusion or pneumothorax.  No acute osseous findings are seen.  The subacromial space of both shoulders is mildly narrowed.  IMPRESSION: Stable chest without evidence of acute process.  Stable cardiomegaly and hiatal hernia.   Original Report Authenticated By: Carey Bullocks, M.D.    REVIEW OF SYSTEMS:  The patient denies recent weight gain or weight loss.  She wears glasses and dentures.  No history of cough, no history of sinus drainage. No history of headache or hemoptysis. No history of asthma, Occasional chest pain, palpitations, and dyspnea.   No GI bleed, hepatitis, or blood transfusion.   Positive history of kidney stone.  No history of stroke, seizures or psychiatric admissions.  Positive joint pains.  Blood pressure 157/54, pulse 73, temperature 97.9 F (36.6 C), temperature source Oral, resp. rate 19, SpO2 99.00%. The patient is an averagely built and nourished black female in no acute distress.  HEENT: The patient is normocephalic and atraumatic with brown eyes.  Conjunctivae pink. Sclerae nonicteric. Tongue midline and wet.  NECK: No JVD. No carotid bruit.  LUNGS: Clear bilaterally.  HEART: Normal S1 and S2 with grade 2/6 systolic murmur at the left sternal border.  ABDOMEN: Soft and nontender.  EXTREMITIES: 2 + edema, no cyanosis or clubbing.  CNS: Cranial nerves grossly intact. Bilaterally equal grips. The patient is right handed.  SKIN: Warm and dry.   Assessment/Plan Bilateral leg edema Hypertension Palpitation Weakness Anxiety Large hiatal hernia  IN lasix/Elevate legs/Echocardiogram/Home medications  Adrena Nakamura S 04/21/2013, 4:25 PM

## 2013-04-21 NOTE — ED Notes (Signed)
Pt here by ems today from cardiologist (there for check up) went to grocery store and became SOB and called ems. Denies other symptoms.

## 2013-04-21 NOTE — ED Notes (Signed)
Pt is requesting that we call her MD, will inform primary rn of same

## 2013-04-21 NOTE — ED Notes (Addendum)
Report attempted x 1

## 2013-04-22 LAB — BASIC METABOLIC PANEL
BUN: 23 mg/dL (ref 6–23)
GFR calc non Af Amer: 42 mL/min — ABNORMAL LOW (ref >90)
Glucose, Bld: 87 mg/dL (ref 70–99)
Potassium: 3.6 mEq/L (ref 3.5–5.1)

## 2013-04-22 MED ORDER — MAGNESIUM HYDROXIDE 400 MG/5ML PO SUSP: 30.0000 mL | Freq: Every day | ORAL | Status: DC | PRN

## 2013-04-22 MED ORDER — LOPERAMIDE HCL 2 MG PO CAPS: 2.0000 mg | ORAL_CAPSULE | ORAL | Status: DC | PRN

## 2013-04-22 MED ORDER — POTASSIUM CHLORIDE CRYS ER 20 MEQ PO TBCR
20.0000 meq | EXTENDED_RELEASE_TABLET | Freq: Two times a day (BID) | ORAL | Status: DC
  Administered 2013-04-22: 20 meq via ORAL
  Filled 2013-04-22: qty 1

## 2013-04-22 MED ORDER — GUAIFENESIN-DM 100-10 MG/5ML PO SYRP: 15.0000 mL | ORAL_SOLUTION | ORAL | Status: DC | PRN

## 2013-04-22 MED ORDER — ALUM & MAG HYDROXIDE-SIMETH 200-200-20 MG/5ML PO SUSP: 30.0000 mL | ORAL | Status: DC | PRN

## 2013-04-22 MED ORDER — ACETAMINOPHEN 325 MG PO TABS
650.0000 mg | ORAL_TABLET | Freq: Four times a day (QID) | ORAL | Status: DC | PRN
  Administered 2013-04-22: 650 mg via ORAL
  Filled 2013-04-22: qty 2

## 2013-04-22 MED ORDER — FUROSEMIDE 40 MG PO TABS
40.0000 mg | ORAL_TABLET | Freq: Every day | ORAL | Status: DC
  Administered 2013-04-22: 40 mg via ORAL
  Filled 2013-04-22: qty 1

## 2013-04-22 NOTE — Progress Notes (Signed)
DC IV, DC Tele, DC Home. Discharge instructions and home medications discussed with patient and patient's family members. Patient and family denied any questions or concerns at this time. Patient leaving unit via wheelchair and appears in no acute distress.  

## 2013-04-22 NOTE — Evaluation (Signed)
Physical Therapy Evaluation Patient Details Name: [name] MRN: [id number] DOB: 05-11-1916 Today's Date: 04/22/2013 Time: 4098-1191 PT Time Calculation (min): 25 min  PT Assessment / Plan / Recommendation History of Present Illness  79 y.o. female with h/o gout, CHG, CAD, "bone on bone" OA (R greater than L knees) admitted with BLE edema.  Clinical Impression  *Pt ambulated 66' with RW and supervision. Pt independent with mobility using 4 wheeled walker at baseline. Expect pt can return home without follow up PT. She would benefit from acute PT to maximize safety and independence with mobility. **    PT Assessment  Patient needs continued PT services    Follow Up Recommendations  No PT follow up    Does the patient have the potential to tolerate intense rehabilitation      Barriers to Discharge        Equipment Recommendations  None recommended by PT    Recommendations for Other Services     Frequency Min 3X/week    Precautions / Restrictions Precautions Precautions: None Precaution Comments: pt denies h/o falls Restrictions Weight Bearing Restrictions: No   Pertinent Vitals/Pain **L great toe -unrated pain RN notified*      Mobility  Bed Mobility Bed Mobility: Supine to Sit Supine to Sit: 6: Modified independent (Device/Increase time);HOB elevated;With rails Transfers Transfers: Sit to Stand;Stand to Sit Sit to Stand: 6: Modified independent (Device/Increase time);From bed;From toilet;With upper extremity assist Stand to Sit: 6: Modified independent (Device/Increase time);To chair/3-in-1;With upper extremity assist;With armrests Ambulation/Gait Ambulation/Gait Assistance: 5: Supervision Ambulation Distance (Feet): 35 Feet Assistive device: Rolling walker Ambulation/Gait Assistance Details: no LOB, VCs to lift head 2* kyphotic posture Gait Pattern: Trunk flexed    Exercises General Exercises - Lower Extremity Ankle Circles/Pumps: AROM;Both;20  reps;Supine Hip ABduction/ADduction: AROM;Both;10 reps;Supine   PT Diagnosis: Difficulty walking;Generalized weakness  PT Problem List: Decreased activity tolerance;Decreased mobility PT Treatment Interventions: Gait training;Functional mobility training;Therapeutic activities;Therapeutic exercise;Patient/family education     PT Goals(Current goals can be found in the care plan section) Acute Rehab PT Goals Patient Stated Goal: return home PT Goal Formulation: With patient Time For Goal Achievement: 05/06/13 Potential to Achieve Goals: Good  Visit Information  Last PT Received On: 04/22/13 Assistance Needed: +1 History of Present Illness: 79 y.o. female with h/o gout, CHG, CAD, "bone on bone" OA (R greater than L knees) admitted with BLE edema.       Prior Functioning  Home Living Family/patient expects to be discharged to:: Private residence Living Arrangements: Other relatives (cousin) Available Help at Discharge: Available PRN/intermittently Home Access: Stairs to enter Entrance Stairs-Number of Steps: 2 Entrance Stairs-Rails: Can reach both;Left;Right Home Layout: One level Home Equipment: Walker - 4 wheels;Bedside commode;Grab bars - tub/shower;Shower seat Prior Function Level of Independence: Independent with assistive device(s) Comments: used 4 wheeled RW  Communication Communication: No difficulties    Cognition  Cognition Arousal/Alertness: Awake/alert Behavior During Therapy: WFL for tasks assessed/performed Overall Cognitive Status: Within Functional Limits for tasks assessed    Extremity/Trunk Assessment Upper Extremity Assessment Upper Extremity Assessment: Overall WFL for tasks assessed Lower Extremity Assessment Lower Extremity Assessment: Overall WFL for tasks assessed (R knee flexion limited to approx 70* 2* OA pain) Cervical / Trunk Assessment Cervical / Trunk Assessment: Kyphotic   Balance Balance Balance Assessed: Yes Static Sitting  Balance Static Sitting - Balance Support: No upper extremity supported;Feet supported Static Sitting - Level of Assistance: 7: Independent Static Sitting - Comment/# of Minutes: 3  End of Session PT -  End of Session Equipment Utilized During Treatment: Gait belt Activity Tolerance: Patient tolerated treatment well Patient left: in chair;with call bell/phone within reach Nurse Communication: Mobility status  GP Functional Assessment Tool Used: clinical judgement Functional Limitation: Mobility: Walking and moving around Mobility: Walking and Moving Around Current Status (F6213): At least 1 percent but less than 20 percent impaired, limited or restricted Mobility: Walking and Moving Around Goal Status [phone]): 0 percent impaired, limited or restricted   Tamala Ser 04/22/2013, 11:22 AM [phone]

## 2013-04-22 NOTE — Progress Notes (Signed)
*  PRELIMINARY RESULTS* Echocardiogram 2D Echocardiogram has been performed.  [name] 04/22/2013, 10:16 AM

## 2013-04-22 NOTE — Progress Notes (Signed)
Subjective:  Feeling better. Decreased leg edema. Afebrile.  Objective:  Vital Signs in the last 24 hours: Temp:  [97 F (36.1 C)-97.9 F (36.6 C)] 97.7 F (36.5 C) (07/24 0449) Pulse Rate:  [61-76] 67 (07/24 0449) Cardiac Rhythm:  [-] Normal sinus rhythm (07/23 2356) Resp:  [13-19] 18 (07/24 0449) BP: (124-186)/(48-76) 136/48 mmHg (07/24 0449) SpO2:  [97 %-100 %] 97 % (07/24 0449) Weight:  [62.869 kg (138 lb 9.6 oz)-64.048 kg (141 lb 3.2 oz)] 62.869 kg (138 lb 9.6 oz) (07/24 0449)  Physical Exam: BP Readings from Last 1 Encounters:  04/22/13 136/48     Wt Readings from Last 1 Encounters:  04/22/13 62.869 kg (138 lb 9.6 oz)    Weight change:   HEENT: [place]/AT, Eyes-Brown, PERL, EOMI, Conjunctiva-Pink, Sclera-Non-icteric Neck: No JVD, No bruit, Trachea midline. Lungs:  Clear, Bilateral. Cardiac:  Regular rhythm, normal S1 and S2, no S3.  Abdomen:  Soft, non-tender. Extremities:  Trace edema present. No cyanosis. No clubbing. CNS: AxOx3, Cranial nerves grossly intact, moves all 4 extremities. Right handed. Skin: Warm and dry.   Intake/Output from previous day: 07/23 0701 - 07/24 0700 In: -  Out: 1875 [Urine:1875]    Lab Results: BMET    Component Value Date/Time   NA 139 04/22/2013 0535   K 3.6 04/22/2013 0535   CL 102 04/22/2013 0535   CO2 30 04/22/2013 0535   GLUCOSE 87 04/22/2013 0535   BUN 23 04/22/2013 0535   CREATININE 1.09 04/22/2013 0535   CALCIUM 8.9 04/22/2013 0535   GFRNONAA 42* 04/22/2013 0535   GFRAA 48* 04/22/2013 0535   CBC    Component Value Date/Time   WBC 3.8* 04/21/2013 2100   RBC 4.19 04/21/2013 2100   HGB 13.0 04/21/2013 2100   HCT 38.5 04/21/2013 2100   PLT 213 04/21/2013 2100   MCV 91.9 04/21/2013 2100   MCH 31.0 04/21/2013 2100   MCHC 33.8 04/21/2013 2100   RDW 14.1 04/21/2013 2100   LYMPHSABS 1.3 04/21/2013 1504   MONOABS 0.3 04/21/2013 1504   EOSABS 0.1 04/21/2013 1504   BASOSABS 0.0 04/21/2013 1504   CARDIAC ENZYMES Lab Results  Component Value  Date   CKTOTAL 41 05/15/2011   CKMB 2.3 05/15/2011   TROPONINI <0.30 04/21/2013    Scheduled Meds: . amLODipine  2.5 mg Oral Daily  . aspirin  81 mg Oral Daily  . docusate sodium  100 mg Oral BID  . furosemide  40 mg Oral Daily  . heparin  5,000 Units Subcutaneous Q8H  . levothyroxine  75 mcg Oral QAC breakfast  . losartan  50 mg Oral Daily  . nitroGLYCERIN  0.2 mg Transdermal Daily  . potassium chloride  20 mEq Oral BID  . sodium chloride  3 mL Intravenous Q12H  . sodium chloride  3 mL Intravenous Q12H   Continuous Infusions:  PRN Meds:.sodium chloride, acetaminophen, alum & mag hydroxide-simeth, alum & mag hydroxide-simeth, guaiFENesin-dextromethorphan, loperamide, magnesium hydroxide, ondansetron (ZOFRAN) IV, ondansetron, sodium chloride  Assessment/Plan: Bilateral leg edema  Hypertension  Palpitation  Weakness  Anxiety  Large hiatal hernia  PT evaluation and treatment.   LOS: 1 day    Orpah Cobb  MD  04/22/2013, 9:39 AM

## 2013-04-22 NOTE — Progress Notes (Signed)
Utilization Review Completed.   Zoha Spranger, RN, BSN Nurse Case Manager  [phone]  

## 2013-04-22 NOTE — Discharge Summary (Signed)
Physician Discharge Summary  Patient ID: [name] MRN: [id number] DOB/AGE: 1916/06/20 79 y.o.  Admit date: 04/21/2013 Discharge date: 04/22/2013  Admission Diagnoses: Bilateral leg edema  Hypertension  Palpitation  Weakness  Anxiety  Large hiatal hernia  Discharge Diagnoses:  Principle Problem: * Bilateral leg edema *  Hypertension  Palpitation  Weakness  Anxiety  Large hiatal hernia Diastolic dysfunction Aortic valve sclerosis  Discharged Condition: fair  Hospital Course: 79 years old female presented with increasing leg edema without chest pain or shortness of breath. She responded well to one dose of IV lasix. She has good LV systolic function and mild diastolic dysfunction with Aortic valve sclerosis on her echocardiogram. Patient was advised to take her medications regularly and elevated her legs and decrease fluid intake by 10 %. She will be rechecked in 1 month.  Consults: None  Significant Diagnostic Studies: labs: Near normal CBC and BMET. Normal LV systolic function.  EKG-NSR with PVC.  Treatments: cardiac meds: Furosemide, amlodipine and Losartan.  Discharge Exam: Blood pressure 131/54, pulse 73, temperature 97.7 F (36.5 C), temperature source Oral, resp. rate 17, height 5\' 1"  (1.549 m), weight 62.869 kg (138 lb 9.6 oz), SpO2 97.00%.  HEENT: [place]/AT, Eyes-Brown, PERL, EOMI, Conjunctiva-Pink, Sclera-Non-icteric  Neck: No JVD, No bruit, Trachea midline.  Lungs: Clear, Bilateral.  Cardiac: Regular rhythm, normal S1 and S2, no S3.  Abdomen: Soft, non-tender.  Extremities: Trace edema present. No cyanosis. No clubbing.  CNS: AxOx3, Cranial nerves grossly intact, moves all 4 extremities. Right handed.  Skin: Warm and dry.  Disposition: 01-Home or Self Care     Medication List         amLODipine 2.5 MG tablet  Commonly known as:  NORVASC  Take 2.5 mg by mouth daily.     aspirin 81 MG chewable tablet  Chew 81 mg by mouth daily.     furosemide 20  MG tablet  Commonly known as:  LASIX  Take 20 mg by mouth daily.     levothyroxine 75 MCG tablet  Commonly known as:  SYNTHROID, LEVOTHROID  Take 75 mcg by mouth daily.     LEVOTHROID 100 MCG tablet  Generic drug:  levothyroxine  Take 100 mcg by mouth daily as needed (Hypothyroidism).     losartan 50 MG tablet  Commonly known as:  COZAAR  Take 50 mg by mouth daily.     nitroGLYCERIN 0.2 mg/hr  Commonly known as:  NITRODUR - Dosed in mg/24 hr  Place 1 patch onto the skin daily.     POTASSIUM CHLORIDE PO  Take 1 tablet by mouth daily.     SYSTANE OP  Apply 1-2 drops to eye daily as needed (for eye dryness).     triamcinolone cream 0.1 %  Commonly known as:  KENALOG  Apply 1 application topically 2 (two) times daily as needed (rash).     VITAMIN D PO  Take 1 tablet by mouth daily as needed (Dietary supplement).         SignedOrpah Cobb S 04/22/2013, 5:06 PM

## 2013-04-25 NOTE — ED Provider Notes (Signed)
CSN: [id number]     Arrival date & time 04/21/13  1318 History     89yf with LE edema and SOB. Seen by Dr. Algie Coffer today. Edema was discussed. He instructed her to call him if she had further concerns. She left office and ran some errands. When she was in the grocery store she began to have some SOB. Denies CP. No fever or chills. No n/v. Reports compliance with medications.   First MD Initiated Contact with Patient 04/21/13 1423     Chief Complaint  Patient presents with  . Shortness of Breath   (Consider location/radiation/quality/duration/timing/severity/associated sxs/prior Treatment) HPI  Past Medical History  Diagnosis Date  . Hypertension   . Gout   . Thyroid disease   . Coronary artery disease   . Arthritis   . CHF (congestive heart failure)   . Hypothyroidism   . Shortness of breath    Past Surgical History  Procedure Laterality Date  . Abdominal hysterectomy    . Appendectomy     History reviewed. No pertinent family history. History  Substance Use Topics  . Smoking status: Never Smoker   . Smokeless tobacco: Not on file  . Alcohol Use: No   OB History   Grav Para Term Preterm Abortions TAB SAB Ect Mult Living                 Review of Systems  Allergies  Crestor  Home Medications   Current Outpatient Rx  Name  Route  Sig  Dispense  Refill  . amLODipine (NORVASC) 2.5 MG tablet   Oral   Take 2.5 mg by mouth daily.         Marland Kitchen aspirin 81 MG chewable tablet   Oral   Chew 81 mg by mouth daily.         . Cholecalciferol (VITAMIN D PO)   Oral   Take 1 tablet by mouth daily as needed (Dietary supplement).          . furosemide (LASIX) 20 MG tablet   Oral   Take 20 mg by mouth daily.         Marland Kitchen levothyroxine (LEVOTHROID) 100 MCG tablet   Oral   Take 100 mcg by mouth daily as needed (Hypothyroidism).         Marland Kitchen levothyroxine (SYNTHROID, LEVOTHROID) 75 MCG tablet   Oral   Take 75 mcg by mouth daily.         Marland Kitchen losartan (COZAAR) 50 MG  tablet   Oral   Take 50 mg by mouth daily.         . nitroGLYCERIN (NITRODUR - DOSED IN MG/24 HR) 0.2 mg/hr   Transdermal   Place 1 patch onto the skin daily.         Bertram Gala Glycol-Propyl Glycol (SYSTANE OP)   Ophthalmic   Apply 1-2 drops to eye daily as needed (for eye dryness).         Marland Kitchen POTASSIUM CHLORIDE PO   Oral   Take 1 tablet by mouth daily.         Marland Kitchen triamcinolone cream (KENALOG) 0.1 %   Topical   Apply 1 application topically 2 (two) times daily as needed (rash).          BP 131/54  Pulse 73  Temp(Src) 97.7 F (36.5 C) (Oral)  Resp 17  Ht 5\' 1"  (1.549 m)  Wt 138 lb 9.6 oz (62.869 kg)  BMI 26.2 kg/m2  SpO2 97% Physical Exam  Nursing note and vitals reviewed. Constitutional: She appears well-developed and well-nourished. No distress.  HENT:  Head: Normocephalic and atraumatic.  Eyes: Conjunctivae are normal. Right eye exhibits no discharge. Left eye exhibits no discharge.  Neck: Neck supple.  Cardiovascular: Normal rate, regular rhythm and normal heart sounds.  Exam reveals no gallop and no friction rub.   No murmur heard. Pulmonary/Chest: Effort normal and breath sounds normal. No respiratory distress.  Abdominal: Soft. She exhibits no distension. There is no tenderness.  Musculoskeletal: She exhibits edema. She exhibits no tenderness.  Lower extremities symmetric as compared to each other. No calf tenderness. Negative Homan's. No palpable cords.   Neurological: She is alert.  Skin: Skin is warm and dry.  Psychiatric: She has a normal mood and affect. Her behavior is normal. Thought content normal.    ED Course   Procedures (including critical care time)  Labs Reviewed  CBC WITH DIFFERENTIAL - Abnormal; Notable for the following:    WBC 3.4 (*)    All other components within normal limits  BASIC METABOLIC PANEL - Abnormal; Notable for the following:    GFR calc non Af Amer 45 (*)    GFR calc Af Amer 52 (*)    All other components  within normal limits  CBC - Abnormal; Notable for the following:    WBC 3.8 (*)    All other components within normal limits  CREATININE, SERUM - Abnormal; Notable for the following:    GFR calc non Af Amer 44 (*)    GFR calc Af Amer 51 (*)    All other components within normal limits  BASIC METABOLIC PANEL - Abnormal; Notable for the following:    GFR calc non Af Amer 42 (*)    GFR calc Af Amer 48 (*)    All other components within normal limits  TROPONIN I  PRO B NATRIURETIC PEPTIDE   EKG:  Rhythm: sinus with PVC 1st degree av block Rate: 69 ST segments: NS ST changes   No results found. 1. Dyspnea     MDM  89yF with mild dyspnea and LE edema. Evaluated by Dr Algie Coffer while in ED. Admitted.   Raeford Razor, MD 04/25/13 1040

## 2013-05-06 DIAGNOSIS — M79609 Pain in unspecified limb: Secondary | ICD-10-CM | POA: Diagnosis not present

## 2013-05-06 DIAGNOSIS — B351 Tinea unguium: Secondary | ICD-10-CM | POA: Diagnosis not present

## 2013-05-26 DIAGNOSIS — R079 Chest pain, unspecified: Secondary | ICD-10-CM | POA: Diagnosis not present

## 2013-05-26 DIAGNOSIS — E039 Hypothyroidism, unspecified: Secondary | ICD-10-CM | POA: Diagnosis not present

## 2013-05-26 DIAGNOSIS — E559 Vitamin D deficiency, unspecified: Secondary | ICD-10-CM | POA: Diagnosis not present

## 2013-05-26 DIAGNOSIS — M2550 Pain in unspecified joint: Secondary | ICD-10-CM | POA: Diagnosis not present

## 2013-07-22 DIAGNOSIS — E559 Vitamin D deficiency, unspecified: Secondary | ICD-10-CM | POA: Diagnosis not present

## 2013-07-22 DIAGNOSIS — M2550 Pain in unspecified joint: Secondary | ICD-10-CM | POA: Diagnosis not present

## 2013-07-22 DIAGNOSIS — E039 Hypothyroidism, unspecified: Secondary | ICD-10-CM | POA: Diagnosis not present

## 2013-07-22 DIAGNOSIS — I10 Essential (primary) hypertension: Secondary | ICD-10-CM | POA: Diagnosis not present

## 2013-08-05 ENCOUNTER — Ambulatory Visit (INDEPENDENT_AMBULATORY_CARE_PROVIDER_SITE_OTHER): Payer: Medicare Other | Admitting: Podiatry

## 2013-08-05 ENCOUNTER — Encounter: Payer: Self-pay | Admitting: Podiatry

## 2013-08-05 VITALS — BP 144/68 | HR 68 | Resp 16 | Ht 65.0 in | Wt 145.0 lb

## 2013-08-05 DIAGNOSIS — B351 Tinea unguium: Secondary | ICD-10-CM

## 2013-08-05 DIAGNOSIS — M79609 Pain in unspecified limb: Secondary | ICD-10-CM

## 2013-08-05 NOTE — Progress Notes (Signed)
[name] presents today with a chief complaint of painful nails 1 through 5 bilateral.  Objective: Pulses are barely palpable bilateral capillary fill time to digits one through 5 is immediate. Feet are warm to the touch. Nails are thick yellow dystrophic clinically mycotic and painful on palpation.  Assessment: Pain in limb secondary to onychomycosis.  Plan: Debridement of nails in thickness and length as a covered service secondary to pain I will followup with her in 3 months.

## 2013-08-15 DIAGNOSIS — Z23 Encounter for immunization: Secondary | ICD-10-CM | POA: Diagnosis not present

## 2013-08-30 DIAGNOSIS — E559 Vitamin D deficiency, unspecified: Secondary | ICD-10-CM | POA: Diagnosis not present

## 2013-08-30 DIAGNOSIS — E039 Hypothyroidism, unspecified: Secondary | ICD-10-CM | POA: Diagnosis not present

## 2013-08-30 DIAGNOSIS — M2550 Pain in unspecified joint: Secondary | ICD-10-CM | POA: Diagnosis not present

## 2013-08-30 DIAGNOSIS — R079 Chest pain, unspecified: Secondary | ICD-10-CM | POA: Diagnosis not present

## 2013-09-16 DIAGNOSIS — J069 Acute upper respiratory infection, unspecified: Secondary | ICD-10-CM | POA: Diagnosis not present

## 2013-09-16 DIAGNOSIS — E039 Hypothyroidism, unspecified: Secondary | ICD-10-CM | POA: Diagnosis not present

## 2013-09-16 DIAGNOSIS — M2550 Pain in unspecified joint: Secondary | ICD-10-CM | POA: Diagnosis not present

## 2013-09-16 DIAGNOSIS — I5022 Chronic systolic (congestive) heart failure: Secondary | ICD-10-CM | POA: Diagnosis not present

## 2013-11-04 ENCOUNTER — Encounter: Payer: Self-pay | Admitting: Podiatry

## 2013-11-04 ENCOUNTER — Ambulatory Visit (INDEPENDENT_AMBULATORY_CARE_PROVIDER_SITE_OTHER): Payer: Medicare Other | Admitting: Podiatry

## 2013-11-04 VITALS — BP 138/80 | HR 72 | Resp 18

## 2013-11-04 DIAGNOSIS — M79609 Pain in unspecified limb: Secondary | ICD-10-CM

## 2013-11-04 DIAGNOSIS — B351 Tinea unguium: Secondary | ICD-10-CM | POA: Diagnosis not present

## 2013-11-04 NOTE — Progress Notes (Signed)
Toenails trimmed.  Objective: Vital signs are stable she is alert and oriented x3. Nails are thick yellow dystrophic onychomycotic and painful palpation.  Assessment: Pain in limb secondary to onychomycosis 1 through 5 bilateral.  Plan: Debridement of nails 1 through 5 bilateral.

## 2013-12-22 DIAGNOSIS — E039 Hypothyroidism, unspecified: Secondary | ICD-10-CM | POA: Diagnosis not present

## 2013-12-22 DIAGNOSIS — R079 Chest pain, unspecified: Secondary | ICD-10-CM | POA: Diagnosis not present

## 2013-12-22 DIAGNOSIS — M2550 Pain in unspecified joint: Secondary | ICD-10-CM | POA: Diagnosis not present

## 2013-12-22 DIAGNOSIS — E559 Vitamin D deficiency, unspecified: Secondary | ICD-10-CM | POA: Diagnosis not present

## 2013-12-23 ENCOUNTER — Encounter (HOSPITAL_COMMUNITY): Payer: Self-pay | Admitting: Emergency Medicine

## 2013-12-23 ENCOUNTER — Emergency Department (HOSPITAL_COMMUNITY)
Admission: EM | Admit: 2013-12-23 | Discharge: 2013-12-23 | Disposition: A | Discharge status: Home / Self Care | Payer: Medicare Other | Attending: Emergency Medicine | Admitting: Emergency Medicine

## 2013-12-23 ENCOUNTER — Emergency Department (HOSPITAL_COMMUNITY): Payer: Medicare Other

## 2013-12-23 DIAGNOSIS — Z79899 Other long term (current) drug therapy: Secondary | ICD-10-CM | POA: Diagnosis not present

## 2013-12-23 DIAGNOSIS — E039 Hypothyroidism, unspecified: Secondary | ICD-10-CM | POA: Insufficient documentation

## 2013-12-23 DIAGNOSIS — R079 Chest pain, unspecified: Secondary | ICD-10-CM | POA: Diagnosis not present

## 2013-12-23 DIAGNOSIS — M2550 Pain in unspecified joint: Secondary | ICD-10-CM | POA: Diagnosis not present

## 2013-12-23 DIAGNOSIS — I10 Essential (primary) hypertension: Secondary | ICD-10-CM | POA: Diagnosis not present

## 2013-12-23 DIAGNOSIS — M542 Cervicalgia: Secondary | ICD-10-CM | POA: Diagnosis not present

## 2013-12-23 DIAGNOSIS — D72819 Decreased white blood cell count, unspecified: Secondary | ICD-10-CM | POA: Insufficient documentation

## 2013-12-23 DIAGNOSIS — M129 Arthropathy, unspecified: Secondary | ICD-10-CM | POA: Insufficient documentation

## 2013-12-23 DIAGNOSIS — I2510 Atherosclerotic heart disease of native coronary artery without angina pectoris: Secondary | ICD-10-CM | POA: Insufficient documentation

## 2013-12-23 DIAGNOSIS — Z7982 Long term (current) use of aspirin: Secondary | ICD-10-CM | POA: Diagnosis not present

## 2013-12-23 DIAGNOSIS — R0602 Shortness of breath: Secondary | ICD-10-CM | POA: Diagnosis not present

## 2013-12-23 DIAGNOSIS — I509 Heart failure, unspecified: Secondary | ICD-10-CM | POA: Diagnosis not present

## 2013-12-23 DIAGNOSIS — D709 Neutropenia, unspecified: Secondary | ICD-10-CM | POA: Diagnosis not present

## 2013-12-23 LAB — CBC WITH DIFFERENTIAL/PLATELET
BASOS ABS: 0 10*3/uL (ref 0.0–0.1)
Basophils Relative: 0 % (ref 0–1)
EOS PCT: 3 % (ref 0–5)
Eosinophils Absolute: 0.1 10*3/uL (ref 0.0–0.7)
HCT: 36.6 % (ref 36.0–46.0)
Hemoglobin: 12.3 g/dL (ref 12.0–15.0)
LYMPHS PCT: 54 % — AB (ref 12–46)
Lymphs Abs: 1.6 10*3/uL (ref 0.7–4.0)
MCH: 30.8 pg (ref 26.0–34.0)
MCHC: 33.6 g/dL (ref 30.0–36.0)
MCV: 91.5 fL (ref 78.0–100.0)
MONOS PCT: 11 % (ref 3–12)
Monocytes Absolute: 0.3 10*3/uL (ref 0.1–1.0)
Neutro Abs: 1 10*3/uL — ABNORMAL LOW (ref 1.7–7.7)
Neutrophils Relative %: 32 % — ABNORMAL LOW (ref 43–77)
PLATELETS: 219 10*3/uL (ref 150–400)
RBC: 4 MIL/uL (ref 3.87–5.11)
RDW: 14 % (ref 11.5–15.5)
WBC: 3 10*3/uL — AB (ref 4.0–10.5)

## 2013-12-23 LAB — BASIC METABOLIC PANEL
BUN: 18 mg/dL (ref 6–23)
CALCIUM: 9.6 mg/dL (ref 8.4–10.5)
CHLORIDE: 102 meq/L (ref 96–112)
CO2: 31 mEq/L (ref 19–32)
CREATININE: 0.83 mg/dL (ref 0.50–1.10)
GFR calc non Af Amer: 58 mL/min — ABNORMAL LOW (ref >90)
GFR, EST AFRICAN AMERICAN: 67 mL/min — AB (ref >90)
Glucose, Bld: 87 mg/dL (ref 70–99)
Potassium: 4.4 mEq/L (ref 3.7–5.3)
SODIUM: 143 meq/L (ref 137–147)

## 2013-12-23 LAB — PATHOLOGIST SMEAR REVIEW

## 2013-12-23 LAB — I-STAT TROPONIN, ED: TROPONIN I, POC: 0.01 ng/mL (ref 0.00–0.08)

## 2013-12-23 LAB — CBG MONITORING, ED: GLUCOSE-CAPILLARY: 68 mg/dL — AB (ref 70–99)

## 2013-12-23 NOTE — ED Notes (Signed)
Pt in wheelchair waiting for family in the waiting room.  Pt was alert and oriented and verbalized understanding for follow up care.

## 2013-12-23 NOTE — ED Notes (Signed)
Dr Manly at bedside 

## 2013-12-23 NOTE — ED Notes (Addendum)
Pt presents from home via [facility] with c/o of shortness of breath.  Pt woke up this morning with shortness of breath, BP 183/80.  Pt took 324mg  Aspirin at home per EMS.  Pt was seen at her family doctor yesterday for her hypertension.  Pt has chronic bilateral foot swelling x 1 year and chronic left shoulder pain x 1 month.

## 2013-12-23 NOTE — ED Provider Notes (Signed)
CSN: [id number]     Arrival date & time 12/23/13  4034 History   First MD Initiated Contact with Patient 12/23/13 [phone]     Chief Complaint  Patient presents with  . Shortness of Breath  . Hypertension     (Consider location/radiation/quality/duration/timing/severity/associated sxs/prior Treatment) HPI [name] is a 80 yo F with HTN and grade I diastolic heart failure with preserved systolic function. She lives in her home alone with some part time assistance.   She is BIB EMS from her home after awakening from sleep shortly PTA with a sense that her heart was skipping a beat.  She feels that her heart "queited down" en route. She notes some transient associated SOB which resolved when her feeling of subjective heart beat irregularity resolved. She denies chest pain. No cough or fever.   The patient denies any recent changes to her medications. She reports compliance with her meds. She notes a history of similar sx in the past. She is asymptomatic at this time without SOB or feeling of palpitations.   Past Medical History  Diagnosis Date  . Hypertension   . Gout   . Thyroid disease   . Coronary artery disease   . Arthritis   . CHF (congestive heart failure)   . Hypothyroidism   . Shortness of breath    Past Surgical History  Procedure Laterality Date  . Abdominal hysterectomy    . Appendectomy     History reviewed. No pertinent family history. History  Substance Use Topics  . Smoking status: Never Smoker   . Smokeless tobacco: Not on file  . Alcohol Use: No   OB History   Grav Para Term Preterm Abortions TAB SAB Ect Mult Living                 Review of Systems  Ten point review of symptoms performed and is negative with the exception of symptoms noted above.    Allergies  Crestor  Home Medications   Current Outpatient Rx  Name  Route  Sig  Dispense  Refill  . amLODipine (NORVASC) 2.5 MG tablet   Oral   Take 2.5 mg by mouth daily.         Marland Kitchen aspirin 81  MG chewable tablet   Oral   Chew 81 mg by mouth daily.         . Cholecalciferol (VITAMIN D PO)   Oral   Take 1 tablet by mouth daily as needed (Dietary supplement).          . furosemide (LASIX) 20 MG tablet   Oral   Take 20 mg by mouth daily.         Marland Kitchen levothyroxine (LEVOTHROID) 100 MCG tablet   Oral   Take 100 mcg by mouth daily as needed (Hypothyroidism).         Marland Kitchen levothyroxine (SYNTHROID, LEVOTHROID) 75 MCG tablet   Oral   Take 75 mcg by mouth daily.         Marland Kitchen losartan (COZAAR) 50 MG tablet   Oral   Take 50 mg by mouth daily.         . nitroGLYCERIN (NITRODUR - DOSED IN MG/24 HR) 0.2 mg/hr   Transdermal   Place 1 patch onto the skin daily.         Vladimir Faster Glycol-Propyl Glycol (SYSTANE OP)   Ophthalmic   Apply 1-2 drops to eye daily as needed (for eye dryness).         Marland Kitchen  POTASSIUM CHLORIDE PO   Oral   Take 1 tablet by mouth daily.         Marland Kitchen triamcinolone cream (KENALOG) 0.1 %   Topical   Apply 1 application topically 2 (two) times daily as needed (rash).          BP 178/64  Pulse 61  Temp(Src) 97.5 F (36.4 C) (Oral)  Resp 13  Ht 5\' 5"  (1.651 m)  Wt 140 lb (63.504 kg)  BMI 23.30 kg/m2  SpO2 100% Physical Exam  Gen: well developed and well nourished appearing Head: NCAT Eyes: PERL, EOMI Nose: no epistaixis or rhinorrhea Mouth/throat: mucosa is moist and pink Neck: supple, no stridor Lungs: CTA B, no wheezing, rhonchi or rales CV: RRR, no murmur, extremities appear well perfused.  Abd: soft, notender, nondistended Back:kyphosis,  no ttp, no cva ttp Skin: warm and dry Ext: normal to inspection, no dependent edema Neuro: CN ii-xii grossly intact, no focal deficits Psyche; normal affect, calm and cooperative.   ED Course  Procedures (including critical care time) Labs Review  EKG: NSR, rate 64 bpm, normal axis, normal QRS, normal intervals, normal ST-T segments.   Results for orders placed during the hospital encounter  of 12/23/13 (from the past 24 hour(s))  BASIC METABOLIC PANEL     Status: Abnormal   Collection Time    12/23/13  5:00 AM      Result Value Ref Range   Sodium 143  137 - 147 mEq/L   Potassium 4.4  3.7 - 5.3 mEq/L   Chloride 102  96 - 112 mEq/L   CO2 31  19 - 32 mEq/L   Glucose, Bld 87  70 - 99 mg/dL   BUN 18  6 - 23 mg/dL   Creatinine, Ser 0.83  0.50 - 1.10 mg/dL   Calcium 9.6  8.4 - 10.5 mg/dL   GFR calc non Af Amer 58 (*) >90 mL/min   GFR calc Af Amer 67 (*) >90 mL/min  CBC WITH DIFFERENTIAL     Status: Abnormal (Preliminary result)   Collection Time    12/23/13  5:00 AM      Result Value Ref Range   WBC 3.0 (*) 4.0 - 10.5 K/uL   RBC 4.00  3.87 - 5.11 MIL/uL   Hemoglobin 12.3  12.0 - 15.0 g/dL   HCT 36.6  36.0 - 46.0 %   MCV 91.5  78.0 - 100.0 fL   MCH 30.8  26.0 - 34.0 pg   MCHC 33.6  30.0 - 36.0 g/dL   RDW 14.0  11.5 - 15.5 %   Platelets 219  150 - 400 K/uL   Neutrophils Relative % PENDING  43 - 77 %   Neutro Abs PENDING  1.7 - 7.7 K/uL   Band Neutrophils PENDING  0 - 10 %   Lymphocytes Relative PENDING  12 - 46 %   Lymphs Abs PENDING  0.7 - 4.0 K/uL   Monocytes Relative PENDING  3 - 12 %   Monocytes Absolute PENDING  0.1 - 1.0 K/uL   Eosinophils Relative PENDING  0 - 5 %   Eosinophils Absolute PENDING  0.0 - 0.7 K/uL   Basophils Relative PENDING  0 - 1 %   Basophils Absolute PENDING  0.0 - 0.1 K/uL   WBC Morphology PENDING     RBC Morphology PENDING     Smear Review PENDING     nRBC PENDING  0 /100 WBC   Metamyelocytes Relative PENDING  Myelocytes PENDING     Promyelocytes Absolute PENDING     Blasts PENDING    I-STAT TROPOININ, ED     Status: None   Collection Time    12/23/13  5:10 AM      Result Value Ref Range   Troponin i, poc 0.01  0.00 - 0.08 ng/mL   Comment 3            DG Chest 1 View (Final result)  Result time: 12/23/13 05:28:39    Final result by Rad Results In Interface (12/23/13 05:28:39)    Narrative:   CLINICAL DATA: Shortness of  breath and palpitations  EXAM: CHEST - 1 VIEW  COMPARISON: Prior radiograph from 04/21/2013.  FINDINGS: Cardiomegaly is stable as compared to prior study. Hiatal hernia noted. Mediastinal silhouette within normal limits.  Lungs are normally inflated. No focal infiltrate, pulmonary edema, or pleural effusion. There is no pneumothorax.  No acute osseus abnormality. Diffuse osteopenia noted.  IMPRESSION: 1. No acute cardiopulmonary abnormality. 2. Hiatal hernia.   Electronically Signed By: Jeannine Boga M.D. On: 12/23/2013 05:28     MDM   Patient has remained asymptomatic. NSR on monitor. ED work up is notable only for chronic, mild leukopenia. The patient has been counseled re: results of ED work up and need to follow up with her PCP. She is stable for discharge with plan for outpatient f/u.    Elyn Peers, MD 12/23/13 [phone]

## 2013-12-23 NOTE — ED Notes (Signed)
Pt given Orange Juice to drink.

## 2014-01-09 ENCOUNTER — Encounter (HOSPITAL_COMMUNITY): Payer: Self-pay | Admitting: Emergency Medicine

## 2014-01-09 ENCOUNTER — Emergency Department (HOSPITAL_COMMUNITY): Payer: Medicare Other

## 2014-01-09 ENCOUNTER — Emergency Department (HOSPITAL_COMMUNITY)
Admission: EM | Admit: 2014-01-09 | Discharge: 2014-01-10 | Disposition: A | Discharge status: Home / Self Care | Payer: Medicare Other | Attending: Emergency Medicine | Admitting: Emergency Medicine

## 2014-01-09 DIAGNOSIS — I10 Essential (primary) hypertension: Secondary | ICD-10-CM | POA: Diagnosis not present

## 2014-01-09 DIAGNOSIS — E039 Hypothyroidism, unspecified: Secondary | ICD-10-CM | POA: Diagnosis not present

## 2014-01-09 DIAGNOSIS — I119 Hypertensive heart disease without heart failure: Secondary | ICD-10-CM | POA: Diagnosis not present

## 2014-01-09 DIAGNOSIS — I2510 Atherosclerotic heart disease of native coronary artery without angina pectoris: Secondary | ICD-10-CM | POA: Diagnosis not present

## 2014-01-09 DIAGNOSIS — F0390 Unspecified dementia without behavioral disturbance: Secondary | ICD-10-CM | POA: Insufficient documentation

## 2014-01-09 DIAGNOSIS — N390 Urinary tract infection, site not specified: Secondary | ICD-10-CM

## 2014-01-09 DIAGNOSIS — R233 Spontaneous ecchymoses: Secondary | ICD-10-CM | POA: Insufficient documentation

## 2014-01-09 DIAGNOSIS — M109 Gout, unspecified: Secondary | ICD-10-CM | POA: Insufficient documentation

## 2014-01-09 DIAGNOSIS — Z79899 Other long term (current) drug therapy: Secondary | ICD-10-CM | POA: Diagnosis not present

## 2014-01-09 DIAGNOSIS — Z7982 Long term (current) use of aspirin: Secondary | ICD-10-CM | POA: Diagnosis not present

## 2014-01-09 DIAGNOSIS — I509 Heart failure, unspecified: Secondary | ICD-10-CM | POA: Insufficient documentation

## 2014-01-09 DIAGNOSIS — R110 Nausea: Secondary | ICD-10-CM | POA: Diagnosis not present

## 2014-01-09 DIAGNOSIS — R0789 Other chest pain: Secondary | ICD-10-CM | POA: Insufficient documentation

## 2014-01-09 DIAGNOSIS — S60219A Contusion of unspecified wrist, initial encounter: Secondary | ICD-10-CM | POA: Diagnosis not present

## 2014-01-09 DIAGNOSIS — R079 Chest pain, unspecified: Secondary | ICD-10-CM | POA: Diagnosis not present

## 2014-01-09 LAB — COMPREHENSIVE METABOLIC PANEL
ALBUMIN: 3.7 g/dL (ref 3.5–5.2)
ALT: 5 U/L (ref 0–35)
AST: 19 U/L (ref 0–37)
Alkaline Phosphatase: 79 U/L (ref 39–117)
BILIRUBIN TOTAL: 0.5 mg/dL (ref 0.3–1.2)
BUN: 23 mg/dL (ref 6–23)
CHLORIDE: 100 meq/L (ref 96–112)
CO2: 26 mEq/L (ref 19–32)
CREATININE: 1.09 mg/dL (ref 0.50–1.10)
Calcium: 9.4 mg/dL (ref 8.4–10.5)
GFR calc Af Amer: 48 mL/min — ABNORMAL LOW (ref >90)
GFR calc non Af Amer: 41 mL/min — ABNORMAL LOW (ref >90)
Glucose, Bld: 93 mg/dL (ref 70–99)
Potassium: 4.4 mEq/L (ref 3.7–5.3)
Sodium: 139 mEq/L (ref 137–147)
Total Protein: 7.5 g/dL (ref 6.0–8.3)

## 2014-01-09 LAB — CBC WITH DIFFERENTIAL/PLATELET
Basophils Absolute: 0 10*3/uL (ref 0.0–0.1)
Basophils Relative: 0 % (ref 0–1)
Eosinophils Absolute: 0.1 10*3/uL (ref 0.0–0.7)
Eosinophils Relative: 3 % (ref 0–5)
HCT: 36.8 % (ref 36.0–46.0)
Hemoglobin: 12.5 g/dL (ref 12.0–15.0)
LYMPHS ABS: 2 10*3/uL (ref 0.7–4.0)
LYMPHS PCT: 68 % — AB (ref 12–46)
MCH: 31 pg (ref 26.0–34.0)
MCHC: 34 g/dL (ref 30.0–36.0)
MCV: 91.3 fL (ref 78.0–100.0)
Monocytes Absolute: 0.2 10*3/uL (ref 0.1–1.0)
Monocytes Relative: 7 % (ref 3–12)
Neutro Abs: 0.7 10*3/uL — ABNORMAL LOW (ref 1.7–7.7)
Neutrophils Relative %: 22 % — ABNORMAL LOW (ref 43–77)
PLATELETS: 211 10*3/uL (ref 150–400)
RBC: 4.03 MIL/uL (ref 3.87–5.11)
RDW: 14 % (ref 11.5–15.5)
WBC: 3 10*3/uL — AB (ref 4.0–10.5)

## 2014-01-09 LAB — TROPONIN I: Troponin I: <0.3 ng/mL (ref <0.30)

## 2014-01-09 LAB — PRO B NATRIURETIC PEPTIDE: Pro B Natriuretic peptide (BNP): 188.3 pg/mL (ref 0–450)

## 2014-01-09 NOTE — ED Notes (Signed)
Patient voided small amount on the bedpan.  Will need to obtain urine at a later time

## 2014-01-09 NOTE — ED Notes (Signed)
Patient arrived via [facility] from home with complaints of hypertension, nausea and right wrist has bruising. BP from PTAR was 201/70. Patient A/O x4, MAE x4.

## 2014-01-09 NOTE — ED Provider Notes (Signed)
CSN: [id number]     Arrival date & time 01/09/14  2130 History   First MD Initiated Contact with Patient 01/09/14 2138     Chief Complaint  Patient presents with  . Hypertension     (Consider location/radiation/quality/duration/timing/severity/associated sxs/prior Treatment) HPI Comments: Patient states she called EMS because the "veins were swollen in my wrist" for the past 3 days. He was worried that blood was not draining from her veins. It appears that this is resolved now. She denies any falls or trauma. She endorses having intermittent chest pain it radiates to her neck and her back lasting several minutes at a time. Last episode this morning. No chest pain now currently. She denies any shortness of breath, nausea, vomiting or abdominal pain. She states compliance with medications. She denies any increase in her lower extremity edema.  The history is provided by the EMS personnel and the patient. The history is limited by the condition of the patient.    Past Medical History  Diagnosis Date  . Hypertension   . Gout   . Thyroid disease   . Coronary artery disease   . Arthritis   . CHF (congestive heart failure)   . Hypothyroidism   . Shortness of breath    Past Surgical History  Procedure Laterality Date  . Abdominal hysterectomy    . Appendectomy     History reviewed. No pertinent family history. History  Substance Use Topics  . Smoking status: Never Smoker   . Smokeless tobacco: Not on file  . Alcohol Use: No   OB History   Grav Para Term Preterm Abortions TAB SAB Ect Mult Living                 Review of Systems  Unable to perform ROS: Dementia      Allergies  Crestor  Home Medications   Current Outpatient Rx  Name  Route  Sig  Dispense  Refill  . amLODipine (NORVASC) 2.5 MG tablet   Oral   Take 2.5 mg by mouth daily.         Marland Kitchen aspirin 81 MG chewable tablet   Oral   Chew 81 mg by mouth daily.         . Cholecalciferol (VITAMIN D PO)    Oral   Take 1 tablet by mouth daily as needed (Dietary supplement).          . furosemide (LASIX) 20 MG tablet   Oral   Take 20 mg by mouth daily as needed (swelling in knees).          . isosorbide mononitrate (IMDUR) 30 MG 24 hr tablet   Oral   Take 30 mg by mouth daily.         Marland Kitchen levothyroxine (LEVOTHROID) 100 MCG tablet   Oral   Take 100 mcg by mouth daily as needed (Hypothyroidism).         Marland Kitchen losartan (COZAAR) 50 MG tablet   Oral   Take 50 mg by mouth daily.         . Menthol-Zinc Oxide (GOLD BOND EX)   Apply externally   Apply 1 application topically daily as needed (itching).         . nitroGLYCERIN (NITRODUR - DOSED IN MG/24 HR) 0.2 mg/hr   Transdermal   Place 1 patch onto the skin daily.         Vladimir Faster Glycol-Propyl Glycol (SYSTANE OP)   Ophthalmic   Apply 1-2 drops to eye  daily as needed (for eye dryness).         Marland Kitchen POTASSIUM CHLORIDE PO   Oral   Take 1 tablet by mouth daily.          BP 153/54  Pulse 55  Temp(Src) 97.7 F (36.5 C) (Oral)  Resp 14  Ht 5\' 4"  (1.626 m)  Wt 141 lb (63.957 kg)  BMI 24.19 kg/m2  SpO2 100% Physical Exam  Constitutional: She is oriented to person, place, and time. She appears well-developed and well-nourished. No distress.  HENT:  Head: Normocephalic and atraumatic.  Mouth/Throat: Oropharynx is clear and moist. No oropharyngeal exudate.  Eyes: Conjunctivae and EOM are normal. Pupils are equal, round, and reactive to light.  Neck: Normal range of motion. Neck supple.  Cardiovascular: Normal rate, regular rhythm and normal heart sounds.   No murmur heard. Pulmonary/Chest: Effort normal and breath sounds normal. No respiratory distress.  Abdominal: Soft. There is no tenderness. There is no rebound and no guarding.  Musculoskeletal: Normal range of motion. She exhibits no edema and no tenderness.  Bruising to the right radial wrist. Intact distal pulses. Cardinal hand movements intact No dilated veins  seen, no edema.  Neurological: She is alert and oriented to person, place, and time. No cranial nerve deficit. She exhibits normal muscle tone. Coordination normal.  Skin: Skin is warm.    ED Course  Procedures (including critical care time) Labs Review Labs Reviewed  CBC WITH DIFFERENTIAL - Abnormal; Notable for the following:    WBC 3.0 (*)    Neutrophils Relative % 22 (*)    Lymphocytes Relative 68 (*)    Neutro Abs 0.7 (*)    All other components within normal limits  COMPREHENSIVE METABOLIC PANEL - Abnormal; Notable for the following:    GFR calc non Af Amer 41 (*)    GFR calc Af Amer 48 (*)    All other components within normal limits  TROPONIN I  PRO B NATRIURETIC PEPTIDE  URINALYSIS, ROUTINE W REFLEX MICROSCOPIC  TROPONIN I   Imaging Review Dg Chest 2 View  01/09/2014   CLINICAL DATA:  Chest pain.  EXAM: CHEST - 2 VIEW  COMPARISON:  DG CHEST 1 VIEW dated 12/23/2013  FINDINGS: The heart size and mediastinal contours are within normal limits. Stable large hiatal hernia. There is no evidence of pulmonary edema, consolidation, pneumothorax, nodule or pleural fluid. The visualized skeletal structures are unremarkable.  IMPRESSION: No active disease.  Stable large hiatal hernia.   Electronically Signed   By: Aletta Edouard M.D.   On: 01/09/2014 23:44   Dg Wrist Complete Right  01/09/2014   CLINICAL DATA:  Bruising along the anterior wrist  EXAM: RIGHT WRIST - COMPLETE 3+ VIEW  COMPARISON:  DG WRIST COMPLETE*R* dated 11/23/2009  FINDINGS: There is generalized osteopenia. There is no acute fracture or dislocation. There are mild degenerative changes of the first MCP joint. The soft tissues are normal.  IMPRESSION: No acute osseous injury of the right wrist. If there is clinical concern regarding a radiographically occult scaphoid fracture, snuff box tenderness or ligamentous injury, then further assessment with MRI is recommended.   Electronically Signed   By: Kathreen Devoid   On:  01/09/2014 23:45     EKG Interpretation   Date/Time:  Sunday January 09 2014 21:49:51 EDT Ventricular Rate:  57 PR Interval:  176 QRS Duration: 90 QT Interval:  427 QTC Calculation: 416 R Axis:   25 Text Interpretation:  Sinus rhythm Baseline wander  in lead(s) V2 No  significant change was found Confirmed by Wyvonnia Dusky  MD, Forrester Blando [phone]) on  01/09/2014 10:07:06 PM      MDM   Final diagnoses:  Chest pain  Hypertension   Patient called EMS because "veins were swollen" in wrist.  Better now.  No SOB.  Fleeting episodes of chest pain that is intermittent, radiating to neck and back.  None now.   EKG nsr. Troponin negative.  No tachycardia, hypoxia, SOB. Doubt PE or dissection. No chest pain in the ED>  D/w Patient's cardiologist Dr. Doylene Canard. He knows patient well. She has no known coronary disease. Her description of chest pain tonight is fairly atypical lasting just a few seconds and now resolved. Her EKG shows no ST changes her troponin is negative. Dr. Doylene Canard wishes to see her in the office tomorrow morning. He agrees with a second troponin. He does not think she needs to be admitted. Patient and husband agreeable.  Dr. Cheri Guppy to disposition after second troponin.  BP 153/54  Pulse 55  Temp(Src) 97.7 F (36.5 C) (Oral)  Resp 14  Ht 5\' 4"  (1.626 m)  Wt 141 lb (63.957 kg)  BMI 24.19 kg/m2  SpO2 100%   Ezequiel Essex, MD 01/10/14 [phone]

## 2014-01-10 DIAGNOSIS — I5022 Chronic systolic (congestive) heart failure: Secondary | ICD-10-CM | POA: Diagnosis not present

## 2014-01-10 DIAGNOSIS — E039 Hypothyroidism, unspecified: Secondary | ICD-10-CM | POA: Diagnosis not present

## 2014-01-10 DIAGNOSIS — M2550 Pain in unspecified joint: Secondary | ICD-10-CM | POA: Diagnosis not present

## 2014-01-10 DIAGNOSIS — R079 Chest pain, unspecified: Secondary | ICD-10-CM | POA: Diagnosis not present

## 2014-01-10 LAB — URINALYSIS, ROUTINE W REFLEX MICROSCOPIC
BILIRUBIN URINE: NEGATIVE
Glucose, UA: NEGATIVE mg/dL
Hgb urine dipstick: NEGATIVE
Ketones, ur: NEGATIVE mg/dL
NITRITE: NEGATIVE
PH: 7.5 (ref 5.0–8.0)
Protein, ur: NEGATIVE mg/dL
SPECIFIC GRAVITY, URINE: 1.01 (ref 1.005–1.030)
Urobilinogen, UA: 0.2 mg/dL (ref 0.0–1.0)

## 2014-01-10 LAB — TROPONIN I

## 2014-01-10 LAB — URINE MICROSCOPIC-ADD ON

## 2014-01-10 LAB — PATHOLOGIST SMEAR REVIEW

## 2014-01-10 MED ORDER — CEPHALEXIN 250 MG PO CAPS
500.0000 mg | ORAL_CAPSULE | Freq: Once | ORAL | Status: AC
  Administered 2014-01-10: 500 mg via ORAL
  Filled 2014-01-10: qty 2

## 2014-01-10 MED ORDER — CEPHALEXIN 500 MG PO CAPS: 500.0000 mg | ORAL_CAPSULE | Freq: Three times a day (TID) | ORAL | Status: DC

## 2014-01-10 NOTE — ED Provider Notes (Signed)
Patient signed out to me by Dr. Wyvonnia Dusky at change of shift. Second troponin is wnl and the patient remains pain free. She is noted to have incidental finding of UTI which we will tx with Keflex. Urine to culture. Patient to f/u with PCP in 2 days for a recheck. Advised re: return precautions.   Elyn Peers, MD 01/10/14 [phone]

## 2014-01-24 DIAGNOSIS — M2550 Pain in unspecified joint: Secondary | ICD-10-CM | POA: Diagnosis not present

## 2014-01-24 DIAGNOSIS — E039 Hypothyroidism, unspecified: Secondary | ICD-10-CM | POA: Diagnosis not present

## 2014-01-24 DIAGNOSIS — E559 Vitamin D deficiency, unspecified: Secondary | ICD-10-CM | POA: Diagnosis not present

## 2014-01-24 DIAGNOSIS — I5021 Acute systolic (congestive) heart failure: Secondary | ICD-10-CM | POA: Diagnosis not present

## 2014-02-01 ENCOUNTER — Ambulatory Visit (INDEPENDENT_AMBULATORY_CARE_PROVIDER_SITE_OTHER): Payer: Medicare Other | Admitting: Podiatry

## 2014-02-01 ENCOUNTER — Encounter: Payer: Self-pay | Admitting: Podiatry

## 2014-02-01 VITALS — BP 156/77 | HR 70 | Resp 12

## 2014-02-01 DIAGNOSIS — B351 Tinea unguium: Secondary | ICD-10-CM | POA: Diagnosis not present

## 2014-02-01 DIAGNOSIS — M79609 Pain in unspecified limb: Secondary | ICD-10-CM | POA: Diagnosis not present

## 2014-02-01 DIAGNOSIS — M722 Plantar fascial fibromatosis: Secondary | ICD-10-CM | POA: Diagnosis not present

## 2014-02-01 NOTE — Progress Notes (Signed)
She presents today with chief complaint of painful E. elongated toenails he like to have them cut. She's also complaining of pain to her left heel.  Objective: Vital signs are stable she is alert and oriented x3. Pulses are strongly palpable bilateral. She has pain on palpation medial continued tubercle of the left heel. No pain on medial and lateral compression of the calcaneus. She has pain on palpation as well as debridement of her thick yellow dystrophic clinically mycotic nails.  Assessment: Pain in limb secondary to onychomycosis. Pain in limb secondary to plantar fasciitis left.  Plan: Debridement nails 1 through 5 bilateral is cover service secondary to pain. Injected the left heel.

## 2014-02-24 DIAGNOSIS — E039 Hypothyroidism, unspecified: Secondary | ICD-10-CM | POA: Diagnosis not present

## 2014-02-24 DIAGNOSIS — I10 Essential (primary) hypertension: Secondary | ICD-10-CM | POA: Diagnosis not present

## 2014-02-24 DIAGNOSIS — K449 Diaphragmatic hernia without obstruction or gangrene: Secondary | ICD-10-CM | POA: Diagnosis not present

## 2014-02-24 DIAGNOSIS — Z1331 Encounter for screening for depression: Secondary | ICD-10-CM | POA: Diagnosis not present

## 2014-02-24 DIAGNOSIS — D72819 Decreased white blood cell count, unspecified: Secondary | ICD-10-CM | POA: Diagnosis not present

## 2014-02-24 DIAGNOSIS — I359 Nonrheumatic aortic valve disorder, unspecified: Secondary | ICD-10-CM | POA: Diagnosis not present

## 2014-02-24 DIAGNOSIS — M109 Gout, unspecified: Secondary | ICD-10-CM | POA: Diagnosis not present

## 2014-02-24 DIAGNOSIS — I519 Heart disease, unspecified: Secondary | ICD-10-CM | POA: Diagnosis not present

## 2014-03-03 DIAGNOSIS — M1710 Unilateral primary osteoarthritis, unspecified knee: Secondary | ICD-10-CM | POA: Diagnosis not present

## 2014-03-09 ENCOUNTER — Observation Stay (HOSPITAL_COMMUNITY)
Admission: AD | Admit: 2014-03-09 | Discharge: 2014-03-10 | Disposition: A | Discharge status: Home / Self Care | Payer: Medicare Other | Source: Ambulatory Visit | Attending: Cardiovascular Disease | Admitting: Cardiovascular Disease

## 2014-03-09 ENCOUNTER — Encounter (HOSPITAL_COMMUNITY): Payer: Self-pay | Admitting: General Practice

## 2014-03-09 DIAGNOSIS — I10 Essential (primary) hypertension: Secondary | ICD-10-CM | POA: Diagnosis not present

## 2014-03-09 DIAGNOSIS — M2550 Pain in unspecified joint: Secondary | ICD-10-CM | POA: Diagnosis not present

## 2014-03-09 DIAGNOSIS — E039 Hypothyroidism, unspecified: Secondary | ICD-10-CM | POA: Diagnosis not present

## 2014-03-09 DIAGNOSIS — R51 Headache: Secondary | ICD-10-CM | POA: Diagnosis not present

## 2014-03-09 LAB — CBC WITH DIFFERENTIAL/PLATELET
BASOS ABS: 0 10*3/uL (ref 0.0–0.1)
Basophils Relative: 0 % (ref 0–1)
Eosinophils Absolute: 0 10*3/uL (ref 0.0–0.7)
Eosinophils Relative: 1 % (ref 0–5)
HCT: 36 % (ref 36.0–46.0)
Hemoglobin: 11.8 g/dL — ABNORMAL LOW (ref 12.0–15.0)
LYMPHS PCT: 48 % — AB (ref 12–46)
Lymphs Abs: 1.3 10*3/uL (ref 0.7–4.0)
MCH: 30.3 pg (ref 26.0–34.0)
MCHC: 32.8 g/dL (ref 30.0–36.0)
MCV: 92.3 fL (ref 78.0–100.0)
MONOS PCT: 7 % (ref 3–12)
Monocytes Absolute: 0.2 10*3/uL (ref 0.1–1.0)
NEUTROS ABS: 1.3 10*3/uL — AB (ref 1.7–7.7)
Neutrophils Relative %: 44 % (ref 43–77)
Platelets: 197 10*3/uL (ref 150–400)
RBC: 3.9 MIL/uL (ref 3.87–5.11)
RDW: 14.2 % (ref 11.5–15.5)
WBC: 2.9 10*3/uL — AB (ref 4.0–10.5)

## 2014-03-09 LAB — COMPREHENSIVE METABOLIC PANEL
ALBUMIN: 3.7 g/dL (ref 3.5–5.2)
ALK PHOS: 79 U/L (ref 39–117)
ALT: 7 U/L (ref 0–35)
AST: 14 U/L (ref 0–37)
BUN: 19 mg/dL (ref 6–23)
CALCIUM: 9.5 mg/dL (ref 8.4–10.5)
CO2: 26 mEq/L (ref 19–32)
Chloride: 100 mEq/L (ref 96–112)
Creatinine, Ser: 0.96 mg/dL (ref 0.50–1.10)
GFR calc non Af Amer: 48 mL/min — ABNORMAL LOW (ref >90)
GFR, EST AFRICAN AMERICAN: 56 mL/min — AB (ref >90)
Glucose, Bld: 107 mg/dL — ABNORMAL HIGH (ref 70–99)
POTASSIUM: 5 meq/L (ref 3.7–5.3)
SODIUM: 139 meq/L (ref 137–147)
Total Bilirubin: 0.6 mg/dL (ref 0.3–1.2)
Total Protein: 7.2 g/dL (ref 6.0–8.3)

## 2014-03-09 LAB — TSH: TSH: 4.09 u[IU]/mL (ref 0.350–4.500)

## 2014-03-09 MED ORDER — ONDANSETRON HCL 4 MG PO TABS: 4.0000 mg | ORAL_TABLET | Freq: Four times a day (QID) | ORAL | Status: DC | PRN

## 2014-03-09 MED ORDER — ISOSORBIDE MONONITRATE ER 30 MG PO TB24
30.0000 mg | ORAL_TABLET | Freq: Every day | ORAL | Status: DC
  Administered 2014-03-09 – 2014-03-10 (×2): 30 mg via ORAL
  Filled 2014-03-09 (×2): qty 1

## 2014-03-09 MED ORDER — AMLODIPINE BESYLATE 2.5 MG PO TABS
2.5000 mg | ORAL_TABLET | Freq: Every day | ORAL | Status: DC
  Administered 2014-03-09 – 2014-03-10 (×2): 2.5 mg via ORAL
  Filled 2014-03-09 (×2): qty 1

## 2014-03-09 MED ORDER — ONDANSETRON HCL 4 MG/2ML IJ SOLN: 4.0000 mg | Freq: Four times a day (QID) | INTRAMUSCULAR | Status: DC | PRN

## 2014-03-09 MED ORDER — POTASSIUM CHLORIDE 20 MEQ/15ML (10%) PO LIQD
10.0000 meq | Freq: Every day | ORAL | Status: DC
  Administered 2014-03-09 – 2014-03-10 (×2): 10 meq via ORAL
  Filled 2014-03-09 (×3): qty 7.5

## 2014-03-09 MED ORDER — ACETAMINOPHEN 650 MG RE SUPP: 650.0000 mg | Freq: Four times a day (QID) | RECTAL | Status: DC | PRN

## 2014-03-09 MED ORDER — ADULT MULTIVITAMIN W/MINERALS CH
1.0000 | ORAL_TABLET | Freq: Every day | ORAL | Status: DC
  Administered 2014-03-09 – 2014-03-10 (×2): 1 via ORAL
  Filled 2014-03-09 (×2): qty 1

## 2014-03-09 MED ORDER — LEVOTHYROXINE SODIUM 100 MCG PO TABS
100.0000 ug | ORAL_TABLET | Freq: Every day | ORAL | Status: DC | PRN
  Filled 2014-03-09: qty 1

## 2014-03-09 MED ORDER — PNEUMOCOCCAL VAC POLYVALENT 25 MCG/0.5ML IJ INJ
0.5000 mL | INJECTION | INTRAMUSCULAR | Status: DC
  Filled 2014-03-09: qty 0.5

## 2014-03-09 MED ORDER — ALUM & MAG HYDROXIDE-SIMETH 200-200-20 MG/5ML PO SUSP: 30.0000 mL | Freq: Four times a day (QID) | ORAL | Status: DC | PRN

## 2014-03-09 MED ORDER — SODIUM CHLORIDE 0.9 % IJ SOLN: 3.0000 mL | INTRAMUSCULAR | Status: DC | PRN

## 2014-03-09 MED ORDER — SODIUM CHLORIDE 0.9 % IV SOLN: 250.0000 mL | INTRAVENOUS | Status: DC | PRN

## 2014-03-09 MED ORDER — HEPARIN SODIUM (PORCINE) 5000 UNIT/ML IJ SOLN
5000.0000 [IU] | Freq: Three times a day (TID) | INTRAMUSCULAR | Status: DC
  Administered 2014-03-09 – 2014-03-10 (×3): 5000 [IU] via SUBCUTANEOUS
  Filled 2014-03-09 (×6): qty 1

## 2014-03-09 MED ORDER — ACETAMINOPHEN 325 MG PO TABS: 650.0000 mg | ORAL_TABLET | Freq: Four times a day (QID) | ORAL | Status: DC | PRN

## 2014-03-09 MED ORDER — SODIUM CHLORIDE 0.9 % IJ SOLN: 3.0000 mL | Freq: Two times a day (BID) | INTRAMUSCULAR | Status: DC

## 2014-03-09 MED ORDER — LOSARTAN POTASSIUM 50 MG PO TABS
50.0000 mg | ORAL_TABLET | Freq: Every day | ORAL | Status: DC
  Administered 2014-03-09 – 2014-03-10 (×2): 50 mg via ORAL
  Filled 2014-03-09 (×2): qty 1

## 2014-03-09 MED ORDER — FUROSEMIDE 20 MG PO TABS
20.0000 mg | ORAL_TABLET | Freq: Every day | ORAL | Status: DC
  Administered 2014-03-09 – 2014-03-10 (×2): 20 mg via ORAL
  Filled 2014-03-09 (×2): qty 1

## 2014-03-09 MED ORDER — SODIUM CHLORIDE 0.9 % IJ SOLN
3.0000 mL | Freq: Two times a day (BID) | INTRAMUSCULAR | Status: DC
  Administered 2014-03-09 (×2): 3 mL via INTRAVENOUS

## 2014-03-09 MED ORDER — DOCUSATE SODIUM 100 MG PO CAPS
100.0000 mg | ORAL_CAPSULE | Freq: Two times a day (BID) | ORAL | Status: DC
  Administered 2014-03-09 (×2): 100 mg via ORAL
  Filled 2014-03-09 (×4): qty 1

## 2014-03-09 MED ORDER — LEVOTHYROXINE SODIUM 75 MCG PO TABS
75.0000 ug | ORAL_TABLET | Freq: Every day | ORAL | Status: DC
  Administered 2014-03-10: 75 ug via ORAL
  Filled 2014-03-09 (×2): qty 1

## 2014-03-09 MED ORDER — ASPIRIN 81 MG PO CHEW
81.0000 mg | CHEWABLE_TABLET | Freq: Every day | ORAL | Status: DC
  Administered 2014-03-09 – 2014-03-10 (×2): 81 mg via ORAL
  Filled 2014-03-09 (×2): qty 1

## 2014-03-09 NOTE — Progress Notes (Signed)
Dear Doctor: Kadakia This patient has been identified as a candidate for PICC for the following reason (s): IV therapy over 48 hours and poor veins/poor circulatory system (CHF, COPD, emphysema, diabetes, steroid use, IV drug abuse, etc.) If you agree, please write an order for the indicated device. For any questions contact the Vascular Access Team at [phone] if no answer, please leave a message.  Thank you for supporting the early vascular access assessment program. 

## 2014-03-09 NOTE — H&P (Signed)
Referring Physician:Laiylah Roettger Doylene Canard, MD    [name] is an 80 y.o. female.                       Chief Complaint: High blood pressure and headache HPI: 80 year old female with chronic leg edema, hypertension, gout, CAD, hypothyroidism and arthritis has elevated blood pressure and headache.   Past Medical History  Diagnosis Date  . Hypertension   . Gout   . Thyroid disease   . Coronary artery disease   . Arthritis   . CHF (congestive heart failure)   . Hypothyroidism   . Shortness of breath       Past Surgical History  Procedure Laterality Date  . Abdominal hysterectomy    . Appendectomy      No family history on file. Social History:  reports that she has never smoked. She does not have any smokeless tobacco history on file. She reports that she does not drink alcohol or use illicit drugs.  Allergies:  Allergies  Allergen Reactions  . Crestor [Rosuvastatin] Nausea Only    Medications Prior to Admission  Medication Sig Dispense Refill  . amLODipine (NORVASC) 2.5 MG tablet Take 2.5 mg by mouth daily.      Marland Kitchen aspirin 81 MG chewable tablet Chew 81 mg by mouth daily.      . Cholecalciferol (VITAMIN D PO) Take 1 tablet by mouth daily as needed (Dietary supplement).       . furosemide (LASIX) 20 MG tablet Take 20 mg by mouth daily as needed (swelling in knees).       . isosorbide mononitrate (IMDUR) 30 MG 24 hr tablet Take 30 mg by mouth daily.      Marland Kitchen levothyroxine (LEVOTHROID) 100 MCG tablet Take 100 mcg by mouth daily as needed (Hypothyroidism).      Marland Kitchen losartan (COZAAR) 50 MG tablet Take 50 mg by mouth daily.      . Menthol-Zinc Oxide (GOLD BOND EX) Apply 1 application topically daily as needed (itching).      . nitroGLYCERIN (NITRODUR - DOSED IN MG/24 HR) 0.2 mg/hr Place 1 patch onto the skin daily.      Vladimir Faster Glycol-Propyl Glycol (SYSTANE OP) Apply 1-2 drops to eye daily as needed (for eye dryness).      Marland Kitchen POTASSIUM CHLORIDE PO Take 1 tablet by mouth daily.         No results found for this or any previous visit (from the past 48 hour(s)). No results found.  Review Of Systems The patient denies recent weight gain or weight loss.  She wears glasses and dentures.  No history of cough, no history of sinus drainage. No history of headache or hemoptysis. No history of asthma,  Occasional chest pain, palpitations, and dyspnea.  No GI bleed, hepatitis, or blood transfusion.  Positive history of kidney stone.  No history of stroke, seizures or psychiatric admissions.  Positive joint pains.   There were no vitals taken for this visit. The patient is an averagely built and nourished black female in no acute distress.  HEENT: The patient is normocephalic and atraumatic with brown eyes.  Conjunctivae pink. Sclerae nonicteric. Tongue midline and wet.  NECK: No JVD. No carotid bruit.  LUNGS: Clear bilaterally.  HEART: Normal S1 and S2 with grade 2/6 systolic murmur at the left sternal border.  ABDOMEN: Soft and nontender.  EXTREMITIES: 2 + edema, no cyanosis or clubbing.  Back:-Significant kyphosis. CNS: Cranial nerves grossly intact.  Bilaterally equal grips. The patient is right handed.  SKIN: Warm and dry.   Assessment/Plan Uncontrolled hypertension  Palpitation  Weakness  Anxiety  Large hiatal hernia Chronic bilateral leg edema  Resume home medications. Monitor.  Coltan Spinello S 03/09/2014, 11:27 AM

## 2014-03-10 LAB — BASIC METABOLIC PANEL
BUN: 25 mg/dL — ABNORMAL HIGH (ref 6–23)
CHLORIDE: 100 meq/L (ref 96–112)
CO2: 26 mEq/L (ref 19–32)
CREATININE: 0.93 mg/dL (ref 0.50–1.10)
Calcium: 9.1 mg/dL (ref 8.4–10.5)
GFR calc Af Amer: 58 mL/min — ABNORMAL LOW (ref >90)
GFR calc non Af Amer: 50 mL/min — ABNORMAL LOW (ref >90)
GLUCOSE: 91 mg/dL (ref 70–99)
POTASSIUM: 4.4 meq/L (ref 3.7–5.3)
Sodium: 140 mEq/L (ref 137–147)

## 2014-03-10 LAB — LIPID PANEL
Cholesterol: 222 mg/dL — ABNORMAL HIGH (ref 0–200)
HDL: 74 mg/dL (ref >39)
LDL Cholesterol: 140 mg/dL — ABNORMAL HIGH (ref 0–99)
Total CHOL/HDL Ratio: 3 RATIO
Triglycerides: 38 mg/dL (ref <150)
VLDL: 8 mg/dL (ref 0–40)

## 2014-03-10 LAB — PRO B NATRIURETIC PEPTIDE: PRO B NATRI PEPTIDE: 616.5 pg/mL — AB (ref 0–450)

## 2014-03-10 NOTE — Discharge Instructions (Signed)
Cardiac Diet °This diet can help prevent heart disease and stroke. Many factors influence your heart health, including eating and exercise habits. Coronary risk rises a lot with abnormal blood fat (lipid) levels. Cardiac meal planning includes limiting unhealthy fats, increasing healthy fats, and making other small dietary changes. General guidelines are as follows: °· Adjust calorie intake to reach and maintain desirable body weight. °· Limit total fat intake to less than 30% of total calories. Saturated fat should be less than 7% of calories. °· Saturated fats are found in animal products and in some vegetable products. Saturated vegetable fats are found in coconut oil, cocoa butter, palm oil, and palm kernel oil. Read labels carefully to avoid these products as much as possible. Use butter in moderation. Choose tub margarines and oils that have 2 grams of fat or less. Good cooking oils are canola and olive oils. °· Practice low-fat cooking techniques. Do not fry food. Instead, broil, bake, boil, steam, grill, roast on a rack, stir-fry, or microwave it. Other fat reducing suggestions include: °· Remove the skin from poultry. °· Remove all visible fat from meats. °· Skim the fat off stews, soups, and gravies before serving them. °· Steam vegetables in water or broth instead of sautéing them in fat. °· Avoid foods with trans fat (or hydrogenated oils), such as commercially fried foods and commercially baked goods. Commercial shortening and deep-frying fats will contain trans fat. °· Increase intake of fruits, vegetables, whole grains, and legumes to replace foods high in fat. °· Increase consumption of nuts, legumes, and seeds to at least 4 servings weekly. One serving of a legume equals ½ cup, and 1 serving of nuts or seeds equals ¼ cup. °· Choose whole grains more often. Have 3 servings per day (a serving is 1 ounce [oz]). °· Eat 4 to 5 servings of vegetables per day. A serving of vegetables is 1 cup of raw leafy  vegetables; ½ cup of raw or cooked cut-up vegetables; ½ cup of vegetable juice. °· Eat 4 to 5 servings of fruit per day. A serving of fruit is 1 medium whole fruit; ¼ cup of dried fruit; ½ cup of fresh, frozen, or canned fruit; ½ cup of 100% fruit juice. °· Increase your intake of dietary fiber to 20 to 30 grams per day. Insoluble fiber may help lower your risk of heart disease and may help curb your appetite.  °Soluble fiber binds cholesterol to be removed from the blood. Foods high in soluble fiber are dried beans, citrus fruits, oats, apples, bananas, broccoli, Brussels sprouts, and eggplant. °· Try to include foods fortified with plant sterols or stanols, such as yogurt, breads, juices, or margarines. Choose several fortified foods to achieve a daily intake of 2 to 3 grams of plant sterols or stanols. °· Foods with omega-3 fats can help reduce your risk of heart disease. Aim to have a 3.5 oz portion of fatty fish twice per week, such as salmon, mackerel, albacore tuna, sardines, lake trout, or herring. If you wish to take a fish oil supplement, choose one that contains 1 gram of both DHA and EPA. °· Limit processed meats to 2 servings (3 oz portion) weekly. °· Limit the sodium in your diet to 1500 milligrams (mg) per day. If you have high blood pressure, talk to a registered dietitian about a DASH (Dietary Approaches to Stop Hypertension) eating plan. °· Limit sweets and beverages with added sugar, such as soda, to no more than 5 servings per week. One   serving is:   °· 1 tablespoon sugar. °· 1 tablespoon jelly or jam. °· ½ cup sorbet. °· 1 cup lemonade. °· ½ cup regular soda. °CHOOSING FOODS °Starches °· Allowed: Breads: All kinds (wheat, rye, raisin, white, oatmeal, Italian, French, and English muffin bread). Low-fat rolls: English muffins, frankfurter and hamburger buns, bagels, pita bread, tortillas (not fried). Pancakes, waffles, biscuits, and muffins made with recommended oil. °· Avoid: Products made with  saturated or trans fats, oils, or whole milk products. Butter rolls, cheese breads, croissants. Commercial doughnuts, muffins, sweet rolls, biscuits, waffles, pancakes, store-bought mixes. °Crackers °· Allowed: Low-fat crackers and snacks: Animal, graham, rye, saltine (with recommended oil, no lard), oyster, and matzo crackers. Bread sticks, melba toast, rusks, flatbread, pretzels, and light popcorn. °· Avoid: High-fat crackers: cheese crackers, butter crackers, and those made with coconut, palm oil, or trans fat (hydrogenated oils). Buttered popcorn. °Cereals °· Allowed: Hot or cold whole-grain cereals. °· Avoid: Cereals containing coconut, hydrogenated vegetable fat, or animal fat. °Potatoes / Pasta / Rice °· Allowed: All kinds of potatoes, rice, and pasta (such as macaroni, spaghetti, and noodles). °· Avoid: Pasta or rice prepared with cream sauce or high-fat cheese. Chow mein noodles, French fries. °Vegetables °· Allowed: All vegetables and vegetable juices. °· Avoid: Fried vegetables. Vegetables in cream, butter, or high-fat cheese sauces. Limit coconut. Fruit in cream or custard. °Protein °· Allowed: Limit your intake of meat, seafood, and poultry to no more than 6 oz (cooked weight) per day. All lean, well-trimmed beef, veal, pork, and lamb. All chicken and turkey without skin. All fish and shellfish. Wild game: wild duck, rabbit, pheasant, and venison. Egg whites or low-cholesterol egg substitutes may be used as desired. Meatless dishes: recipes with dried beans, peas, lentils, and tofu (soybean curd). Seeds and nuts: all seeds and most nuts. °· Avoid: Prime grade and other heavily marbled and fatty meats, such as short ribs, spare ribs, rib eye roast or steak, frankfurters, sausage, bacon, and high-fat luncheon meats, mutton. Caviar. Commercially fried fish. Domestic duck, goose, venison sausage. Organ meats: liver, gizzard, heart, chitterlings, brains, kidney, sweetbreads. °Dairy °· Allowed: Low-fat  cheeses: nonfat or low-fat cottage cheese (1% or 2% fat), cheeses made with part skim milk, such as mozzarella, farmers, string, or ricotta. (Cheeses should be labeled no more than 2 to 6 grams fat per oz.). Skim (or 1%) milk: liquid, powdered, or evaporated. Buttermilk made with low-fat milk. Drinks made with skim or low-fat milk or cocoa. Chocolate milk or cocoa made with skim or low-fat (1%) milk. Nonfat or low-fat yogurt. °· Avoid: Whole milk cheeses, including colby, cheddar, muenster, Monterey Jack, Havarti, Brie, Camembert, American, Swiss, and blue. Creamed cottage cheese, cream cheese. Whole milk and whole milk products, including buttermilk or yogurt made from whole milk, drinks made from whole milk. Condensed milk, evaporated whole milk, and 2% milk. °Soups and Combination Foods °· Allowed: Low-fat low-sodium soups: broth, dehydrated soups, homemade broth, soups with the fat removed, homemade cream soups made with skim or low-fat milk. Low-fat spaghetti, lasagna, chili, and Spanish rice if low-fat ingredients and low-fat cooking techniques are used. °· Avoid: Cream soups made with whole milk, cream, or high-fat cheese. All other soups. °Desserts and Sweets °· Allowed: Sherbet, fruit ices, gelatins, meringues, and angel food cake. Homemade desserts with recommended fats, oils, and milk products. Jam, jelly, honey, marmalade, sugars, and syrups. Pure sugar candy, such as gum drops, hard candy, jelly beans, marshmallows, mints, and small amounts of dark chocolate. °· Avoid: Commercially prepared   cakes, pies, cookies, frosting, pudding, or mixes for these products. Desserts containing whole milk products, chocolate, coconut, lard, palm oil, or palm kernel oil. Ice cream or ice cream drinks. Candy that contains chocolate, coconut, butter, hydrogenated fat, or unknown ingredients. Buttered syrups. °Fats and Oils °· Allowed: Vegetable oils: safflower, sunflower, corn, soybean, cottonseed, sesame, canola, olive,  or peanut. Non-hydrogenated margarines. Salad dressing or mayonnaise: homemade or commercial, made with a recommended oil. Low or nonfat salad dressing or mayonnaise. °· Limit added fats and oils to 6 to 8 tsp per day (includes fats used in cooking, baking, salads, and spreads on bread). Remember to count the "hidden fats" in foods. °· Avoid: Solid fats and shortenings: butter, lard, salt pork, bacon drippings. Gravy containing meat fat, shortening, or suet. Cocoa butter, coconut. Coconut oil, palm oil, palm kernel oil, or hydrogenated oils: these ingredients are often used in bakery products, nondairy creamers, whipped toppings, candy, and commercially fried foods. Read labels carefully. Salad dressings made of unknown oils, sour cream, or cheese, such as blue cheese and Roquefort. Cream, all kinds: half-and-half, light, heavy, or whipping. Sour cream or cream cheese (even if "light" or low-fat). Nondairy cream substitutes: coffee creamers and sour cream substitutes made with palm, palm kernel, hydrogenated oils, or coconut oil. °Beverages °· Allowed: Coffee (regular or decaffeinated), tea. Diet carbonated beverages, mineral water. Alcohol: Check with your caregiver. Moderation is recommended. °· Avoid: Whole milk, regular sodas, and juice drinks with added sugar. °Condiments °· Allowed: All seasonings and condiments. Cocoa powder. "Cream" sauces made with recommended ingredients. °· Avoid: Carob powder made with hydrogenated fats. °SAMPLE MENU °Breakfast °· ½ cup orange juice °· ½ cup oatmeal °· 1 slice toast °· 1 tsp margarine °· 1 cup skim milk °Lunch °· Turkey sandwich with 2 oz turkey, 2 slices bread °· Lettuce and tomato slices °· Fresh fruit °· Carrot sticks °· Coffee or tea °Snack °· Fresh fruit or low-fat crackers °Dinner °· 3 oz lean ground beef °· 1 baked potato °· 1 tsp margarine °· ½ cup asparagus °· Lettuce salad °· 1 tbs non-creamy dressing °· ½ cup peach slices °· 1 cup skim milk °Document Released:  06/25/2008 Document Revised: 03/17/2012 Document Reviewed: 12/10/2011 °[employer]® Patient Information ©2014 [employer]. ° °

## 2014-03-10 NOTE — Discharge Summary (Signed)
Physician Discharge Summary  Patient ID: [name] MRN: [id number] DOB/AGE: 78/19/1917 78 y.o.  Admit date: 03/09/2014 Discharge date: 03/10/2014  Admission Diagnoses: Uncontrolled hypertension  Palpitation  Weakness  Anxiety  Large hiatal hernia  Chronic bilateral leg edema  Discharge Diagnoses:  Principle Problem: * Acute on chronic left heart systolic failure* Uncontrolled hypertension  Palpitation  Weakness  Anxiety  Large hiatal hernia  Chronic bilateral leg edema Chronic leukopenia Mild anemia, etiology unknown Hypothyroidism  Discharged Condition: fair  Hospital Course: 80 year old female with chronic leg edema, hypertension, gout, CAD, hypothyroidism and arthritis has elevated blood pressure and headache. Her BNP was elevated. She responded well to resuming home medications including lasix. She was discharged home with over 50 % of leg edema resolved and improved blood pressure control.  Consults: cardiology  Significant Diagnostic Studies: labs: Chronic leukopenia. Borderline low Hgb of 11.8. Near normal CMET. Cholesterol 222 mg with LDL of 140 and HDL of 74. Pro-BNP of 616.5  Treatments: cardiac meds: amlodipine, furosemide and Imdur.  Discharge Exam: Blood pressure 166/76, pulse 76, temperature 97.7 F (36.5 C), temperature source Oral, resp. rate 18, height 5\' 1"  (1.549 m), weight 62 kg (136 lb 11 oz), SpO2 97.00%. The patient is an averagely built and nourished black female in no acute distress.  HEENT: The patient is normocephalic and atraumatic with brown eyes.  Conjunctivae pink. Sclerae nonicteric. Tongue midline and wet.  NECK: No JVD. No carotid bruit.  LUNGS: Clear bilaterally.  HEART: Normal S1 and S2 with grade 2/6 systolic murmur at the left sternal border.  ABDOMEN: Soft and nontender.  EXTREMITIES: Trace + edema, no cyanosis or clubbing.  Back:-Significant kyphosis.  CNS: Cranial nerves grossly intact. Bilaterally equal grips. The patient  is right handed.  SKIN: Warm and dry.   Disposition: 01-Home or Self Care     Medication List    STOP taking these medications       nitroGLYCERIN 0.2 mg/hr patch  Commonly known as:  NITRODUR - Dosed in mg/24 hr      TAKE these medications       amLODipine 2.5 MG tablet  Commonly known as:  NORVASC  Take 2.5 mg by mouth daily.     aspirin 81 MG chewable tablet  Chew 81 mg by mouth daily.     furosemide 20 MG tablet  Commonly known as:  LASIX  Take 20 mg by mouth daily as needed (swelling in knees).     GOLD BOND EX  Apply 1 application topically daily as needed (itching).     isosorbide mononitrate 30 MG 24 hr tablet  Commonly known as:  IMDUR  Take 30 mg by mouth daily.     levothyroxine 75 MCG tablet  Commonly known as:  SYNTHROID, LEVOTHROID  Take 75 mcg by mouth daily before breakfast.     losartan 50 MG tablet  Commonly known as:  COZAAR  Take 50 mg by mouth daily.     POTASSIUM CHLORIDE PO  Take 1 tablet by mouth daily.     SYSTANE OP  Apply 1-2 drops to eye daily as needed (for eye dryness).     VITAMIN D PO  Take 1 tablet by mouth daily as needed (Dietary supplement).           Follow-up Information   Follow up with [facility] [place] [facility], MD. Schedule an appointment as soon as possible for a visit in 1 month.   Specialty:  Cardiology   Contact information:  [address] [place] [address] [phone]       Signed: Birdie Riddle 03/10/2014, 9:34 AM

## 2014-04-12 DIAGNOSIS — M2550 Pain in unspecified joint: Secondary | ICD-10-CM | POA: Diagnosis not present

## 2014-04-12 DIAGNOSIS — E039 Hypothyroidism, unspecified: Secondary | ICD-10-CM | POA: Diagnosis not present

## 2014-04-12 DIAGNOSIS — I10 Essential (primary) hypertension: Secondary | ICD-10-CM | POA: Diagnosis not present

## 2014-04-12 DIAGNOSIS — I5022 Chronic systolic (congestive) heart failure: Secondary | ICD-10-CM | POA: Diagnosis not present

## 2014-04-26 DIAGNOSIS — M949 Disorder of cartilage, unspecified: Secondary | ICD-10-CM | POA: Diagnosis not present

## 2014-04-26 DIAGNOSIS — M899 Disorder of bone, unspecified: Secondary | ICD-10-CM | POA: Diagnosis not present

## 2014-04-26 DIAGNOSIS — M109 Gout, unspecified: Secondary | ICD-10-CM | POA: Diagnosis not present

## 2014-04-26 DIAGNOSIS — I10 Essential (primary) hypertension: Secondary | ICD-10-CM | POA: Diagnosis not present

## 2014-04-26 DIAGNOSIS — E039 Hypothyroidism, unspecified: Secondary | ICD-10-CM | POA: Diagnosis not present

## 2014-05-02 DIAGNOSIS — R609 Edema, unspecified: Secondary | ICD-10-CM | POA: Diagnosis not present

## 2014-05-02 DIAGNOSIS — Z0000 Encounter for general adult medical examination without abnormal findings: Secondary | ICD-10-CM | POA: Diagnosis not present

## 2014-05-02 DIAGNOSIS — L259 Unspecified contact dermatitis, unspecified cause: Secondary | ICD-10-CM | POA: Diagnosis not present

## 2014-05-02 DIAGNOSIS — E039 Hypothyroidism, unspecified: Secondary | ICD-10-CM | POA: Diagnosis not present

## 2014-05-02 DIAGNOSIS — I10 Essential (primary) hypertension: Secondary | ICD-10-CM | POA: Diagnosis not present

## 2014-05-02 DIAGNOSIS — M109 Gout, unspecified: Secondary | ICD-10-CM | POA: Diagnosis not present

## 2014-05-02 DIAGNOSIS — Z23 Encounter for immunization: Secondary | ICD-10-CM | POA: Diagnosis not present

## 2014-05-02 DIAGNOSIS — M810 Age-related osteoporosis without current pathological fracture: Secondary | ICD-10-CM | POA: Diagnosis not present

## 2014-05-05 ENCOUNTER — Encounter: Payer: Self-pay | Admitting: Podiatry

## 2014-05-05 ENCOUNTER — Ambulatory Visit (INDEPENDENT_AMBULATORY_CARE_PROVIDER_SITE_OTHER): Payer: Medicare Other | Admitting: Podiatry

## 2014-05-05 DIAGNOSIS — M79609 Pain in unspecified limb: Secondary | ICD-10-CM | POA: Diagnosis not present

## 2014-05-05 DIAGNOSIS — B351 Tinea unguium: Secondary | ICD-10-CM | POA: Diagnosis not present

## 2014-05-05 DIAGNOSIS — M79676 Pain in unspecified toe(s): Secondary | ICD-10-CM

## 2014-05-05 NOTE — Progress Notes (Signed)
She presents today for followup of a chief complaint of painful elongated toenails.  Objective: Nails are thick yellow dystrophic onychomycotic and painful palpation. Pulses are palpable bilateral.  Assessment: Pain in limb secondary to onychomycosis 1 through 5 bilateral.  Plan: Debridement of nails 1 through 5 bilateral covered service secondary to pain.

## 2014-05-18 DIAGNOSIS — H2510 Age-related nuclear cataract, unspecified eye: Secondary | ICD-10-CM | POA: Diagnosis not present

## 2014-05-18 DIAGNOSIS — H35349 Macular cyst, hole, or pseudohole, unspecified eye: Secondary | ICD-10-CM | POA: Diagnosis not present

## 2014-05-18 DIAGNOSIS — Z961 Presence of intraocular lens: Secondary | ICD-10-CM | POA: Diagnosis not present

## 2014-06-22 DIAGNOSIS — Z6825 Body mass index (BMI) 25.0-25.9, adult: Secondary | ICD-10-CM | POA: Diagnosis not present

## 2014-06-22 DIAGNOSIS — M810 Age-related osteoporosis without current pathological fracture: Secondary | ICD-10-CM | POA: Diagnosis not present

## 2014-08-03 DIAGNOSIS — E784 Other hyperlipidemia: Secondary | ICD-10-CM | POA: Diagnosis not present

## 2014-08-03 DIAGNOSIS — Z79899 Other long term (current) drug therapy: Secondary | ICD-10-CM | POA: Diagnosis not present

## 2014-08-03 DIAGNOSIS — E785 Hyperlipidemia, unspecified: Secondary | ICD-10-CM | POA: Diagnosis not present

## 2014-08-03 DIAGNOSIS — E039 Hypothyroidism, unspecified: Secondary | ICD-10-CM | POA: Diagnosis not present

## 2014-08-03 DIAGNOSIS — M2550 Pain in unspecified joint: Secondary | ICD-10-CM | POA: Diagnosis not present

## 2014-08-03 DIAGNOSIS — D649 Anemia, unspecified: Secondary | ICD-10-CM | POA: Diagnosis not present

## 2014-08-03 DIAGNOSIS — E559 Vitamin D deficiency, unspecified: Secondary | ICD-10-CM | POA: Diagnosis not present

## 2014-08-03 DIAGNOSIS — I5022 Chronic systolic (congestive) heart failure: Secondary | ICD-10-CM | POA: Diagnosis not present

## 2014-08-11 ENCOUNTER — Ambulatory Visit: Payer: BC Managed Care – PPO | Admitting: Podiatry

## 2014-08-11 NOTE — Progress Notes (Deleted)
She presents today for followup of a chief complaint of painful elongated toenails.  Objective: Nails are thick yellow dystrophic onychomycotic and painful palpation. Pulses are palpable bilateral.  Assessment: Pain in limb secondary to onychomycosis 1 through 5 bilateral.  Plan: Debridement of nails 1 through 5 bilateral covered service secondary to pain.

## 2014-08-11 NOTE — Progress Notes (Deleted)
   Subjective:    Patient ID: [name], female    DOB: 1915/11/19, 80 y.o.   MRN: [id number]  HPI Pt presents for nail debridement   Review of Systems     Objective:   Physical Exam        Assessment & Plan:

## 2014-08-18 ENCOUNTER — Ambulatory Visit (INDEPENDENT_AMBULATORY_CARE_PROVIDER_SITE_OTHER): Payer: Medicare Other | Admitting: Podiatry

## 2014-08-18 DIAGNOSIS — M79673 Pain in unspecified foot: Secondary | ICD-10-CM | POA: Diagnosis not present

## 2014-08-18 DIAGNOSIS — B351 Tinea unguium: Secondary | ICD-10-CM

## 2014-08-18 NOTE — Progress Notes (Signed)
She presents today for followup of a chief complaint of painful elongated toenails.  Objective: Nails are thick yellow dystrophic onychomycotic and painful palpation. Pulses are palpable bilateral.  Assessment: Pain in limb secondary to onychomycosis 1 through 5 bilateral.  Plan: Debridement of nails 1 through 5 bilateral covered service secondary to pain.

## 2014-08-18 NOTE — Progress Notes (Deleted)
She presents today for followup of a chief complaint of painful elongated toenails.  Objective: Nails are thick yellow dystrophic onychomycotic and painful palpation. Pulses are palpable bilateral.  Assessment: Pain in limb secondary to onychomycosis 1 through 5 bilateral.  Plan: Debridement of nails 1 through 5 bilateral covered service secondary to pain.

## 2014-08-29 DIAGNOSIS — M25569 Pain in unspecified knee: Secondary | ICD-10-CM | POA: Diagnosis not present

## 2014-08-29 DIAGNOSIS — I5022 Chronic systolic (congestive) heart failure: Secondary | ICD-10-CM | POA: Diagnosis not present

## 2014-08-29 DIAGNOSIS — E784 Other hyperlipidemia: Secondary | ICD-10-CM | POA: Diagnosis not present

## 2014-08-29 DIAGNOSIS — E039 Hypothyroidism, unspecified: Secondary | ICD-10-CM | POA: Diagnosis not present

## 2014-09-26 ENCOUNTER — Encounter (HOSPITAL_COMMUNITY): Payer: Self-pay | Admitting: Emergency Medicine

## 2014-09-26 ENCOUNTER — Emergency Department (HOSPITAL_COMMUNITY): Payer: Medicare Other

## 2014-09-26 ENCOUNTER — Emergency Department (HOSPITAL_COMMUNITY)
Admission: EM | Admit: 2014-09-26 | Discharge: 2014-09-26 | Disposition: A | Discharge status: Home / Self Care | Payer: Medicare Other | Attending: Emergency Medicine | Admitting: Emergency Medicine

## 2014-09-26 DIAGNOSIS — I10 Essential (primary) hypertension: Secondary | ICD-10-CM | POA: Diagnosis not present

## 2014-09-26 DIAGNOSIS — E039 Hypothyroidism, unspecified: Secondary | ICD-10-CM | POA: Diagnosis not present

## 2014-09-26 DIAGNOSIS — R002 Palpitations: Secondary | ICD-10-CM | POA: Diagnosis not present

## 2014-09-26 DIAGNOSIS — Z7982 Long term (current) use of aspirin: Secondary | ICD-10-CM | POA: Insufficient documentation

## 2014-09-26 DIAGNOSIS — Z8739 Personal history of other diseases of the musculoskeletal system and connective tissue: Secondary | ICD-10-CM | POA: Insufficient documentation

## 2014-09-26 DIAGNOSIS — R079 Chest pain, unspecified: Secondary | ICD-10-CM | POA: Diagnosis not present

## 2014-09-26 DIAGNOSIS — Z79899 Other long term (current) drug therapy: Secondary | ICD-10-CM | POA: Insufficient documentation

## 2014-09-26 DIAGNOSIS — R072 Precordial pain: Secondary | ICD-10-CM | POA: Diagnosis not present

## 2014-09-26 DIAGNOSIS — I509 Heart failure, unspecified: Secondary | ICD-10-CM | POA: Diagnosis not present

## 2014-09-26 DIAGNOSIS — R000 Tachycardia, unspecified: Secondary | ICD-10-CM | POA: Diagnosis not present

## 2014-09-26 DIAGNOSIS — I2510 Atherosclerotic heart disease of native coronary artery without angina pectoris: Secondary | ICD-10-CM | POA: Insufficient documentation

## 2014-09-26 LAB — CBC
HCT: 32.9 % — ABNORMAL LOW (ref 36.0–46.0)
HEMOGLOBIN: 11 g/dL — AB (ref 12.0–15.0)
MCH: 30.7 pg (ref 26.0–34.0)
MCHC: 33.4 g/dL (ref 30.0–36.0)
MCV: 91.9 fL (ref 78.0–100.0)
Platelets: 181 10*3/uL (ref 150–400)
RBC: 3.58 MIL/uL — ABNORMAL LOW (ref 3.87–5.11)
RDW: 14.1 % (ref 11.5–15.5)
WBC: 3.1 10*3/uL — AB (ref 4.0–10.5)

## 2014-09-26 LAB — BASIC METABOLIC PANEL
Anion gap: 4 — ABNORMAL LOW (ref 5–15)
BUN: 21 mg/dL (ref 6–23)
CO2: 26 mmol/L (ref 19–32)
Calcium: 8.9 mg/dL (ref 8.4–10.5)
Chloride: 109 mEq/L (ref 96–112)
Creatinine, Ser: 1.1 mg/dL (ref 0.50–1.10)
GFR calc Af Amer: 48 mL/min — ABNORMAL LOW (ref >90)
GFR, EST NON AFRICAN AMERICAN: 41 mL/min — AB (ref >90)
Glucose, Bld: 91 mg/dL (ref 70–99)
Potassium: 4.6 mmol/L (ref 3.5–5.1)
SODIUM: 139 mmol/L (ref 135–145)

## 2014-09-26 LAB — BRAIN NATRIURETIC PEPTIDE: B Natriuretic Peptide: 225.1 pg/mL — ABNORMAL HIGH (ref 0.0–100.0)

## 2014-09-26 LAB — I-STAT TROPONIN, ED: TROPONIN I, POC: 0.01 ng/mL (ref 0.00–0.08)

## 2014-09-26 LAB — TROPONIN I: Troponin I: <0.03 ng/mL (ref <0.031)

## 2014-09-26 NOTE — ED Notes (Signed)
Pt returned from X-ray.  

## 2014-09-26 NOTE — ED Provider Notes (Signed)
CSN: [id number]     Arrival date & time 09/26/14  9323 History   First MD Initiated Contact with Patient 09/26/14 [phone]     Chief Complaint  Patient presents with  . Chest Pain     (Consider location/radiation/quality/duration/timing/severity/associated sxs/prior Treatment) Patient is a 80 y.o. female presenting with chest pain.  Chest Pain Pain location:  Substernal area Pain quality: sharp   Pain radiates to:  L arm Pain severity:  Severe Onset quality:  Sudden Duration: A few hours. Timing:  Constant Progression:  Resolved Context comment:  Awoke her from sleep Relieved by:  Nothing Ineffective treatments: Imdur, Tylenol, Norvasc. Associated symptoms: palpitations (heart pounding)   Associated symptoms: no dizziness, no nausea, no shortness of breath and not vomiting     Past Medical History  Diagnosis Date  . Hypertension   . Gout   . Thyroid disease   . Coronary artery disease   . Arthritis   . CHF (congestive heart failure)   . Hypothyroidism   . Shortness of breath    Past Surgical History  Procedure Laterality Date  . Abdominal hysterectomy    . Appendectomy    . Tonsillectomy    . Carpal tunnel release Right    No family history on file. History  Substance Use Topics  . Smoking status: Never Smoker   . Smokeless tobacco: Never Used  . Alcohol Use: No   OB History    No data available     Review of Systems  Respiratory: Negative for shortness of breath.   Cardiovascular: Positive for chest pain and palpitations (heart pounding).  Gastrointestinal: Negative for nausea and vomiting.  Neurological: Negative for dizziness.  All other systems reviewed and are negative.     Allergies  Crestor  Home Medications   Prior to Admission medications   Medication Sig Start Date End Date Taking? Authorizing Provider  amLODipine (NORVASC) 2.5 MG tablet Take 2.5 mg by mouth daily.    Historical Provider, MD  aspirin 81 MG chewable tablet Chew 81 mg by  mouth daily.    Historical Provider, MD  Cholecalciferol (VITAMIN D PO) Take 1 tablet by mouth daily as needed (Dietary supplement).     Historical Provider, MD  furosemide (LASIX) 20 MG tablet Take 20 mg by mouth daily as needed (swelling in knees).     Historical Provider, MD  isosorbide mononitrate (IMDUR) 30 MG 24 hr tablet Take 30 mg by mouth daily. 12/23/13   Historical Provider, MD  levothyroxine (SYNTHROID, LEVOTHROID) 75 MCG tablet Take 75 mcg by mouth daily before breakfast.    Historical Provider, MD  losartan (COZAAR) 50 MG tablet Take 50 mg by mouth daily.    Historical Provider, MD  Menthol-Zinc Oxide (GOLD BOND EX) Apply 1 application topically daily as needed (itching).    Historical Provider, MD  Polyethyl Glycol-Propyl Glycol (SYSTANE OP) Apply 1-2 drops to eye daily as needed (for eye dryness).    Historical Provider, MD  POTASSIUM CHLORIDE PO Take 1 tablet by mouth daily.    Historical Provider, MD   BP 160/66 mmHg  Pulse 61  Temp(Src) 97 F (36.1 C) (Oral)  Resp 15  Ht 5\' 3"  (1.6 m)  Wt 135 lb (61.236 kg)  BMI 23.92 kg/m2  SpO2 100% Physical Exam  Constitutional: She is oriented to person, place, and time. She appears well-developed and well-nourished. No distress.  Elderly  HENT:  Head: Normocephalic and atraumatic.  Mouth/Throat: Oropharynx is clear and moist.  Eyes: Conjunctivae  are normal. Pupils are equal, round, and reactive to light. No scleral icterus.  Neck: Neck supple.  Cardiovascular: Normal rate, regular rhythm, normal heart sounds and intact distal pulses.   Occasional extrasystoles are present.  No murmur heard. Pulmonary/Chest: Effort normal and breath sounds normal. No stridor. No respiratory distress. She has no rales.  Abdominal: Soft. Bowel sounds are normal. She exhibits no distension. There is no tenderness. There is no rebound and no guarding.  Musculoskeletal: Normal range of motion. She exhibits edema (bilateral lower extremity).   Neurological: She is alert and oriented to person, place, and time.  Skin: Skin is warm and dry. No rash noted.  Psychiatric: She has a normal mood and affect. Her behavior is normal.  Nursing note and vitals reviewed.   ED Course  Procedures (including critical care time) Labs Review Labs Reviewed  CBC - Abnormal; Notable for the following:    WBC 3.1 (*)    RBC 3.58 (*)    Hemoglobin 11.0 (*)    HCT 32.9 (*)    All other components within normal limits  BASIC METABOLIC PANEL - Abnormal; Notable for the following:    GFR calc non Af Amer 41 (*)    GFR calc Af Amer 48 (*)    Anion gap 4 (*)    All other components within normal limits  BRAIN NATRIURETIC PEPTIDE - Abnormal; Notable for the following:    B Natriuretic Peptide 225.1 (*)    All other components within normal limits  TROPONIN I  I-STAT TROPOININ, ED    Imaging Review Dg Chest 2 View  09/26/2014   CLINICAL DATA:  Chest pain, tachycardia, and left arm pain.  EXAM: CHEST  2 VIEW  COMPARISON:  01/09/2014  FINDINGS: Cardiac silhouette is within normal limits for size. A large hiatal hernia is again seen. Mild, chronic interstitial prominence does not appear significantly changed. Trace bilateral pleural effusions are questioned. No confluent airspace opacity, pulmonary edema, or pneumothorax is identified. No acute osseous abnormality is identified.  IMPRESSION: 1. Possible trace pleural effusions. 2. No evidence of pneumonia. 3. Large hiatal hernia.   Electronically Signed   By: Logan Bores   On: 09/26/2014 10:13  All radiology studies independently viewed by me.      EKG Interpretation   Date/Time:  Monday September 26 2014 08:56:49 EST Ventricular Rate:  63 PR Interval:  187 QRS Duration: 91 QT Interval:  425 QTC Calculation: 435 R Axis:   43 Text Interpretation:  Sinus rhythm Ventricular premature complex No  significant change was found Confirmed by [facility]  MD, TREY ([id number]) on  09/26/2014 9:47:24 AM       MDM   Final diagnoses:  Chest pain  Palpitations    80 year old female with a history of CAD who presents with chest pain, now resolved. Also had palpitations.  Initial workup reassuring.  I discussed her case with Dr. Doylene Canard, who evaluated pt in the ED.  He recommended continued medical management of her cardiac disease and discharge home.  Delta troponin ordered, but due to apparent lab error, this test took longer than expected.  Can be discharged home if negative.      Houston Siren III, MD 09/26/14 [phone]

## 2014-09-26 NOTE — Discharge Instructions (Signed)
Chest Pain (Nonspecific) It is often hard to give a diagnosis for the cause of chest pain. There is always a chance that your pain could be related to something serious, such as a heart attack or a blood clot in the lungs. You need to follow up with your doctor. HOME CARE  If antibiotic medicine was given, take it as directed by your doctor. Finish the medicine even if you start to feel better.  For the next few days, avoid activities that bring on chest pain. Continue physical activities as told by your doctor.  Do not use any tobacco products. This includes cigarettes, chewing tobacco, and e-cigarettes.  Avoid drinking alcohol.  Only take medicine as told by your doctor.  Follow your doctor's suggestions for more testing if your chest pain does not go away.  Keep all doctor visits you made. GET HELP IF:  Your chest pain does not go away, even after treatment.  You have a rash with blisters on your chest.  You have a fever. GET HELP RIGHT AWAY IF:   You have more pain or pain that spreads to your arm, neck, jaw, back, or belly (abdomen).  You have shortness of breath.  You cough more than usual or cough up blood.  You have very bad back or belly pain.  You feel sick to your stomach (nauseous) or throw up (vomit).  You have very bad weakness.  You pass out (faint).  You have chills. This is an emergency. Do not wait to see if the problems will go away. Call your local emergency services (911 in [place] Do not drive yourself to the hospital. MAKE SURE YOU:   Understand these instructions.  Will watch your condition.  Will get help right away if you are not doing well or get worse. Document Released: 03/04/2008 Document Revised: 09/21/2013 Document Reviewed: 03/04/2008 [facility] Patient Information 2015 [place], [place]. This information is not intended to replace advice given to you by your health care provider. Make sure you discuss any questions you have with your  health care provider.  Palpitations A palpitation is the feeling that your heartbeat is irregular. It may feel like your heart is fluttering or skipping a beat. It may also feel like your heart is beating faster than normal. This is usually not a serious problem. In some cases, you may need more medical tests. HOME CARE  Avoid:  Caffeine in coffee, tea, soft drinks, diet pills, and energy drinks.  Chocolate.  Alcohol.  Stop smoking if you smoke.  Reduce your stress and anxiety. Try:  A method that measures bodily functions so you can learn to control them (biofeedback).  Yoga.  Meditation.  Physical activity such as swimming, jogging, or walking.  Get plenty of rest and sleep. GET HELP IF:  Your fast or irregular heartbeat continues after 24 hours.  Your palpitations occur more often. GET HELP RIGHT AWAY IF:   You have chest pain.  You feel short of breath.  You have a very bad headache.  You feel dizzy or pass out (faint). MAKE SURE YOU:   Understand these instructions.  Will watch your condition.  Will get help right away if you are not doing well or get worse. Document Released: 06/25/2008 Document Revised: 01/31/2014 Document Reviewed: 11/15/2011 [facility] Patient Information 2015 [place], [place]. This information is not intended to replace advice given to you by your health care provider. Make sure you discuss any questions you have with your health care provider.

## 2014-09-26 NOTE — ED Notes (Signed)
MD at bedside. 

## 2014-09-26 NOTE — ED Notes (Signed)
Patient transported to X-ray 

## 2014-09-26 NOTE — ED Notes (Signed)
Cardiology MD at bedside.

## 2014-09-26 NOTE — ED Notes (Signed)
This RN spoke with lab states that Troponin will  Need to be redrawn because the machine "kicked it off and it has been too long now"

## 2014-09-26 NOTE — ED Provider Notes (Signed)
Second troponin negative.  Will d/c home  Nat Christen, MD 09/26/14 [phone]

## 2014-09-26 NOTE — ED Notes (Signed)
Referring Physician:  MARKEISHA MANCIAS is an 80 y.o. female.                       Chief Complaint: Palpitation and chest pain  HPI: 80 year old female with right sided chest pain radiating to right arm, improved with aspirin and Imdur use early this AM by the time to came to ER. H/O CAD, Hypertension, hypothyroidism, Arthritis, leg edema. Patient is DNR and has minimal activity but living independently.  Past Medical History  Diagnosis Date  . Hypertension   . Gout   . Thyroid disease   . Coronary artery disease   . Arthritis   . CHF (congestive heart failure)   . Hypothyroidism   . Shortness of breath       Past Surgical History  Procedure Laterality Date  . Abdominal hysterectomy    . Appendectomy    . Tonsillectomy    . Carpal tunnel release Right     No family history on file. Social History:  reports that she has never smoked. She has never used smokeless tobacco. She reports that she does not drink alcohol or use illicit drugs.  Allergies:  Allergies  Allergen Reactions  . Crestor [Rosuvastatin] Nausea Only     (Not in a hospital admission)  Results for orders placed or performed during the hospital encounter of 09/26/14 (from the past 48 hour(s))  CBC     Status: Abnormal   Collection Time: 09/26/14  9:28 AM  Result Value Ref Range   WBC 3.1 (L) 4.0 - 10.5 K/uL    Comment: WHITE COUNT CONFIRMED ON SMEAR REPEATED TO VERIFY    RBC 3.58 (L) 3.87 - 5.11 MIL/uL   Hemoglobin 11.0 (L) 12.0 - 15.0 g/dL   HCT 32.9 (L) 36.0 - 46.0 %   MCV 91.9 78.0 - 100.0 fL   MCH 30.7 26.0 - 34.0 pg   MCHC 33.4 30.0 - 36.0 g/dL   RDW 14.1 11.5 - 15.5 %   Platelets 181 150 - 400 K/uL    Comment: PLATELET COUNT CONFIRMED BY SMEAR  Basic metabolic panel     Status: Abnormal   Collection Time: 09/26/14  9:28 AM  Result Value Ref Range   Sodium 139 135 - 145 mmol/L    Comment: Please note change in reference range.   Potassium 4.6 3.5 - 5.1 mmol/L    Comment: Please note  change in reference range.   Chloride 109 96 - 112 mEq/L   CO2 26 19 - 32 mmol/L   Glucose, Bld 91 70 - 99 mg/dL   BUN 21 6 - 23 mg/dL   Creatinine, Ser 1.10 0.50 - 1.10 mg/dL   Calcium 8.9 8.4 - 10.5 mg/dL   GFR calc non Af Amer 41 (L) >90 mL/min   GFR calc Af Amer 48 (L) >90 mL/min    Comment: (NOTE) The eGFR has been calculated using the CKD EPI equation. This calculation has not been validated in all clinical situations. eGFR's persistently <90 mL/min signify possible Chronic Kidney Disease.    Anion gap 4 (L) 5 - 15  Brain natriuretic peptide     Status: Abnormal   Collection Time: 09/26/14  9:28 AM  Result Value Ref Range   B Natriuretic Peptide 225.1 (H) 0.0 - 100.0 pg/mL    Comment: Please note change in reference range.  I-stat troponin, ED (not at [facility])     Status: None   Collection Time: 09/26/14  9:40 AM  Result Value Ref Range   Troponin i, poc 0.01 0.00 - 0.08 ng/mL   Comment 3            Comment: Due to the release kinetics of cTnI, a negative result within the first hours of the onset of symptoms does not rule out myocardial infarction with certainty. If myocardial infarction is still suspected, repeat the test at appropriate intervals.    Dg Chest 2 View  09/26/2014   CLINICAL DATA:  Chest pain, tachycardia, and left arm pain.  EXAM: CHEST  2 VIEW  COMPARISON:  01/09/2014  FINDINGS: Cardiac silhouette is within normal limits for size. A large hiatal hernia is again seen. Mild, chronic interstitial prominence does not appear significantly changed. Trace bilateral pleural effusions are questioned. No confluent airspace opacity, pulmonary edema, or pneumothorax is identified. No acute osseous abnormality is identified.  IMPRESSION: 1. Possible trace pleural effusions. 2. No evidence of pneumonia. 3. Large hiatal hernia.   Electronically Signed   By: Logan Bores   On: 09/26/2014 10:13    Review Of Systems The patient denies recent weight gain or weight loss.   She wears glasses and dentures.  No history of cough, no history of sinus drainage. No history of headache or hemoptysis. No history of asthma,  Occasional chest pain, palpitations, and dyspnea.  No GI bleed, hepatitis, or blood transfusion. Positive large hiatal hernia. Positive history of kidney stone.  No history of stroke, seizures or psychiatric admissions.  Positive joint pains.  Blood pressure 146/60, pulse 49, temperature 97 F (36.1 C), temperature source Oral, resp. rate 13, height '5\' 3"'  (1.6 m), weight 61.236 kg (135 lb), SpO2 100 %. Physical exam: The patient is an averagely built and nourished black female in no acute distress.  HEENT: The patient is normocephalic and atraumatic with brown eyes, Conjunctivae pink. Sclerae nonicteric. Tongue midline and wet.  NECK: No JVD. No carotid bruit.  LUNGS: Clear bilaterally.  HEART: Normal S1 and S2 with grade 2/6 systolic murmur at the left sternal border.  ABDOMEN: Soft and nontender.  EXTREMITIES: 2 + edema, no cyanosis or clubbing.  Back:-Significant kyphosis. CNS: Cranial nerves grossly intact. Bilaterally equal grips. The patient is right handed.  SKIN: Warm and dry. Assessment/Plan Chest pain Palpitation Hypertension CAD Large hiatal hernia Chronic bilateral leg edema  Continue medical treatment.  Birdie Riddle, MD  09/26/2014, 11:48 AM

## 2014-09-26 NOTE — ED Notes (Signed)
Pt states she needs to finish her food and will not get dress until she is done.

## 2014-09-26 NOTE — ED Notes (Signed)
Ordered [employer] diet dinner tray

## 2014-09-26 NOTE — ED Notes (Signed)
Pt states she was sitting in her chair and started having rt sided chest pain that radiated to the rt arm. Pt states<" I took my BP meds and I didn't have any more pain" Pt denies chest pain on arrival.

## 2014-09-26 NOTE — ED Notes (Signed)
Spoke with LAB about Troponin.  LAB states results should be available within 20 mins

## 2014-11-07 DIAGNOSIS — Z6825 Body mass index (BMI) 25.0-25.9, adult: Secondary | ICD-10-CM | POA: Diagnosis not present

## 2014-11-07 DIAGNOSIS — I10 Essential (primary) hypertension: Secondary | ICD-10-CM | POA: Diagnosis not present

## 2014-11-07 DIAGNOSIS — R600 Localized edema: Secondary | ICD-10-CM | POA: Diagnosis not present

## 2014-11-08 ENCOUNTER — Ambulatory Visit (HOSPITAL_COMMUNITY)
Admission: RE | Admit: 2014-11-08 | Discharge: 2014-11-08 | Disposition: A | Discharge status: Home / Self Care | Payer: Medicare Other | Source: Ambulatory Visit | Attending: Vascular Surgery | Admitting: Vascular Surgery

## 2014-11-08 ENCOUNTER — Other Ambulatory Visit (HOSPITAL_COMMUNITY): Payer: Self-pay | Admitting: Internal Medicine

## 2014-11-08 DIAGNOSIS — R609 Edema, unspecified: Secondary | ICD-10-CM

## 2014-11-08 DIAGNOSIS — R600 Localized edema: Secondary | ICD-10-CM | POA: Insufficient documentation

## 2014-11-08 NOTE — Progress Notes (Signed)
Spoke with Estill Bamberg @ [facility] about positive right peroneal dvt and possible baker's cyst rt pop fossa. After she spoke with Dr. Ardeth Perfect instructions were given to have the patient come directly to the office. I relayed these instructions to the patient.

## 2014-11-16 DIAGNOSIS — H35372 Puckering of macula, left eye: Secondary | ICD-10-CM | POA: Diagnosis not present

## 2014-11-16 DIAGNOSIS — H2511 Age-related nuclear cataract, right eye: Secondary | ICD-10-CM | POA: Diagnosis not present

## 2014-11-16 DIAGNOSIS — H04123 Dry eye syndrome of bilateral lacrimal glands: Secondary | ICD-10-CM | POA: Diagnosis not present

## 2014-12-20 DIAGNOSIS — Z6825 Body mass index (BMI) 25.0-25.9, adult: Secondary | ICD-10-CM | POA: Diagnosis not present

## 2014-12-20 DIAGNOSIS — I8290 Acute embolism and thrombosis of unspecified vein: Secondary | ICD-10-CM | POA: Diagnosis not present

## 2014-12-21 DIAGNOSIS — Z79899 Other long term (current) drug therapy: Secondary | ICD-10-CM | POA: Diagnosis not present

## 2014-12-21 DIAGNOSIS — Z6824 Body mass index (BMI) 24.0-24.9, adult: Secondary | ICD-10-CM | POA: Diagnosis not present

## 2014-12-21 DIAGNOSIS — M810 Age-related osteoporosis without current pathological fracture: Secondary | ICD-10-CM | POA: Diagnosis not present

## 2015-01-10 ENCOUNTER — Other Ambulatory Visit (HOSPITAL_COMMUNITY): Payer: Self-pay | Admitting: Internal Medicine

## 2015-01-17 ENCOUNTER — Encounter (HOSPITAL_COMMUNITY)
Admission: RE | Admit: 2015-01-17 | Discharge: 2015-01-17 | Disposition: A | Discharge status: Home / Self Care | Payer: Medicare Other | Source: Ambulatory Visit | Attending: Internal Medicine | Admitting: Internal Medicine

## 2015-01-17 ENCOUNTER — Encounter (HOSPITAL_COMMUNITY): Payer: Self-pay

## 2015-01-17 DIAGNOSIS — M810 Age-related osteoporosis without current pathological fracture: Secondary | ICD-10-CM | POA: Diagnosis not present

## 2015-01-17 HISTORY — DX: Age-related osteoporosis without current pathological fracture: M81.0

## 2015-01-17 MED ORDER — DENOSUMAB 60 MG/ML ~~LOC~~ SOLN
60.0000 mg | Freq: Once | SUBCUTANEOUS | Status: AC
  Administered 2015-01-17: 60 mg via SUBCUTANEOUS
  Filled 2015-01-17: qty 1

## 2015-01-30 ENCOUNTER — Emergency Department (HOSPITAL_COMMUNITY)
Admission: EM | Admit: 2015-01-30 | Discharge: 2015-01-30 | Disposition: A | Discharge status: Home / Self Care | Payer: Medicare Other | Attending: Emergency Medicine | Admitting: Emergency Medicine

## 2015-01-30 ENCOUNTER — Encounter (HOSPITAL_COMMUNITY): Payer: Self-pay | Admitting: Emergency Medicine

## 2015-01-30 DIAGNOSIS — R030 Elevated blood-pressure reading, without diagnosis of hypertension: Secondary | ICD-10-CM | POA: Diagnosis not present

## 2015-01-30 DIAGNOSIS — Z7982 Long term (current) use of aspirin: Secondary | ICD-10-CM | POA: Diagnosis not present

## 2015-01-30 DIAGNOSIS — M810 Age-related osteoporosis without current pathological fracture: Secondary | ICD-10-CM | POA: Diagnosis not present

## 2015-01-30 DIAGNOSIS — Z79899 Other long term (current) drug therapy: Secondary | ICD-10-CM | POA: Insufficient documentation

## 2015-01-30 DIAGNOSIS — I10 Essential (primary) hypertension: Secondary | ICD-10-CM

## 2015-01-30 DIAGNOSIS — E039 Hypothyroidism, unspecified: Secondary | ICD-10-CM | POA: Insufficient documentation

## 2015-01-30 DIAGNOSIS — I509 Heart failure, unspecified: Secondary | ICD-10-CM | POA: Insufficient documentation

## 2015-01-30 DIAGNOSIS — M1990 Unspecified osteoarthritis, unspecified site: Secondary | ICD-10-CM | POA: Insufficient documentation

## 2015-01-30 DIAGNOSIS — E079 Disorder of thyroid, unspecified: Secondary | ICD-10-CM | POA: Insufficient documentation

## 2015-01-30 DIAGNOSIS — I2510 Atherosclerotic heart disease of native coronary artery without angina pectoris: Secondary | ICD-10-CM | POA: Insufficient documentation

## 2015-01-30 DIAGNOSIS — Z7902 Long term (current) use of antithrombotics/antiplatelets: Secondary | ICD-10-CM | POA: Diagnosis not present

## 2015-01-30 MED ORDER — METOPROLOL SUCCINATE ER 25 MG PO TB24
25.0000 mg | ORAL_TABLET | Freq: Every day | ORAL | Status: DC
  Administered 2015-01-30: 25 mg via ORAL
  Filled 2015-01-30: qty 1

## 2015-01-30 MED ORDER — LOSARTAN POTASSIUM 50 MG PO TABS
50.0000 mg | ORAL_TABLET | Freq: Every day | ORAL | Status: DC
  Administered 2015-01-30: 50 mg via ORAL
  Filled 2015-01-30: qty 1

## 2015-01-30 NOTE — ED Notes (Signed)
Bed: NV98 Expected date:  Expected time:  Means of arrival:  Comments: EMS- elderly, HTN

## 2015-01-30 NOTE — ED Notes (Signed)
Pt awaiting niece for pickup.

## 2015-01-30 NOTE — Discharge Instructions (Signed)
It was our pleasure to provide your ER care today - we hope that you feel better.  Continue your blood pressure medication. Limit salt intake.  Follow up with your primary care doctor in the next few days for recheck - call office to arrange follow up appointment.  Return to ER if worse, new symptoms, fevers, trouble breathing, other concern.   Hypertension Hypertension, commonly called high blood pressure, is when the force of blood pumping through your arteries is too strong. Your arteries are the blood vessels that carry blood from your heart throughout your body. A blood pressure reading consists of a higher number over a lower number, such as 110/72. The higher number (systolic) is the pressure inside your arteries when your heart pumps. The lower number (diastolic) is the pressure inside your arteries when your heart relaxes. Ideally you want your blood pressure below 120/80. Hypertension forces your heart to work harder to pump blood. Your arteries may become narrow or stiff. Having hypertension puts you at risk for heart disease, stroke, and other problems.  RISK FACTORS Some risk factors for high blood pressure are controllable. Others are not.  Risk factors you cannot control include:   Race. You may be at higher risk if you are African American.  Age. Risk increases with age.  Gender. Men are at higher risk than women before age 60 years. After age 33, women are at higher risk than men. Risk factors you can control include:  Not getting enough exercise or physical activity.  Being overweight.  Getting too much fat, sugar, calories, or salt in your diet.  Drinking too much alcohol. SIGNS AND SYMPTOMS Hypertension does not usually cause signs or symptoms. Extremely high blood pressure (hypertensive crisis) may cause headache, anxiety, shortness of breath, and nosebleed. DIAGNOSIS  To check if you have hypertension, your health care provider will measure your blood pressure  while you are seated, with your arm held at the level of your heart. It should be measured at least twice using the same arm. Certain conditions can cause a difference in blood pressure between your right and left arms. A blood pressure reading that is higher than normal on one occasion does not mean that you need treatment. If one blood pressure reading is high, ask your health care provider about having it checked again. TREATMENT  Treating high blood pressure includes making lifestyle changes and possibly taking medicine. Living a healthy lifestyle can help lower high blood pressure. You may need to change some of your habits. Lifestyle changes may include:  Following the DASH diet. This diet is high in fruits, vegetables, and whole grains. It is low in salt, red meat, and added sugars.  Getting at least 2 hours of brisk physical activity every week.  Losing weight if necessary.  Not smoking.  Limiting alcoholic beverages.  Learning ways to reduce stress. If lifestyle changes are not enough to get your blood pressure under control, your health care provider may prescribe medicine. You may need to take more than one. Work closely with your health care provider to understand the risks and benefits. HOME CARE INSTRUCTIONS  Have your blood pressure rechecked as directed by your health care provider.   Take medicines only as directed by your health care provider. Follow the directions carefully. Blood pressure medicines must be taken as prescribed. The medicine does not work as well when you skip doses. Skipping doses also puts you at risk for problems.   Do not smoke.  Monitor your blood pressure at home as directed by your health care provider. SEEK MEDICAL CARE IF:   You think you are having a reaction to medicines taken.  You have recurrent headaches or feel dizzy.  You have swelling in your ankles.  You have trouble with your vision. SEEK IMMEDIATE MEDICAL CARE IF:  You  develop a severe headache or confusion.  You have unusual weakness, numbness, or feel faint.  You have severe chest or abdominal pain.  You vomit repeatedly.  You have trouble breathing. MAKE SURE YOU:   Understand these instructions.  Will watch your condition.  Will get help right away if you are not doing well or get worse. Document Released: 09/16/2005 Document Revised: 01/31/2014 Document Reviewed: 07/09/2013 [facility] Patient Information 2015 [place], [place]. This information is not intended to replace advice given to you by your health care provider. Make sure you discuss any questions you have with your health care provider.

## 2015-01-30 NOTE — ED Provider Notes (Signed)
CSN: [id number]     Arrival date & time 01/30/15  1146 History   First MD Initiated Contact with Patient 01/30/15 1206     Chief Complaint  Patient presents with  . Hypertension     (Consider location/radiation/quality/duration/timing/severity/associated sxs/prior Treatment) Patient is a 81 y.o. female presenting with hypertension. The history is provided by the patient.  Hypertension Pertinent negatives include no chest pain, no abdominal pain, no headaches and no shortness of breath.  Patient w hx htn, c/o blood pressure reading high on home monitor today so she called ems.  States was told bp in range 190/.  Pt states otherwise she feels fine. No headache. No chest pain or discomfort. No sob or unusual doe. No increased leg edema. No numbness/weakness. No change in speech or vision.  Denies recent change in meds, but says has not taken any of her bp meds yet today.     Past Medical History  Diagnosis Date  . Hypertension   . Gout   . Thyroid disease   . Coronary artery disease   . Arthritis   . CHF (congestive heart failure)   . Hypothyroidism   . Shortness of breath   . Osteoporosis    Past Surgical History  Procedure Laterality Date  . Abdominal hysterectomy    . Appendectomy    . Tonsillectomy    . Carpal tunnel release Right    No family history on file. History  Substance Use Topics  . Smoking status: Never Smoker   . Smokeless tobacco: Never Used  . Alcohol Use: No   OB History    No data available     Review of Systems  Constitutional: Negative for fever and chills.  HENT: Negative for sore throat.   Eyes: Negative for visual disturbance.  Respiratory: Negative for shortness of breath.   Cardiovascular: Negative for chest pain.  Gastrointestinal: Negative for abdominal pain.  Genitourinary: Negative for dysuria and flank pain.  Musculoskeletal: Negative for back pain and neck pain.  Skin: Negative for rash.  Neurological: Negative for weakness,  numbness and headaches.  Hematological: Does not bruise/bleed easily.  Psychiatric/Behavioral: Negative for confusion.      Allergies  Crestor  Home Medications   Prior to Admission medications   Medication Sig Start Date End Date Taking? Authorizing Provider  acetaminophen (TYLENOL) 325 MG tablet Take 650 mg by mouth every 6 (six) hours as needed for mild pain.   Yes Historical Provider, MD  apixaban (ELIQUIS) 5 MG TABS tablet Take 5 mg by mouth 2 (two) times daily.   Yes Historical Provider, MD  aspirin 81 MG chewable tablet Chew 81 mg by mouth daily.   Yes Historical Provider, MD  Cholecalciferol (VITAMIN D PO) Take 1 tablet by mouth daily as needed (Dietary supplement).    Yes Historical Provider, MD  denosumab (PROLIA) 60 MG/ML SOLN injection Inject 60 mg into the skin every 6 (six) months. Administer in upper arm, thigh, or abdomen   Yes Historical Provider, MD  furosemide (LASIX) 40 MG tablet Take 1 tablet by mouth daily as needed for fluid or edema.  01/17/15  Yes Historical Provider, MD  isosorbide mononitrate (IMDUR) 30 MG 24 hr tablet Take 30 mg by mouth daily. 12/23/13   Historical Provider, MD  levothyroxine (SYNTHROID, LEVOTHROID) 75 MCG tablet Take 75 mcg by mouth daily before breakfast.    Historical Provider, MD  losartan (COZAAR) 50 MG tablet Take 50 mg by mouth daily.    Historical Provider, MD  Menthol-Zinc Oxide (GOLD BOND EX) Apply 1 application topically daily as needed (itching).    Historical Provider, MD  metoprolol succinate (TOPROL-XL) 25 MG 24 hr tablet Take 25 mg by mouth daily.    Historical Provider, MD  Omega-3 Fatty Acids (OMEGA 3 PO) Take 1 tablet by mouth daily.    Historical Provider, MD  Polyethyl Glycol-Propyl Glycol (SYSTANE OP) Apply 1-2 drops to eye daily as needed (for eye dryness).    Historical Provider, MD  potassium chloride (K-DUR,KLOR-CON) 10 MEQ tablet Take 10 mEq by mouth once.    Historical Provider, MD  POTASSIUM CHLORIDE PO Take 1 tablet  by mouth daily.    Historical Provider, MD   BP 186/57 mmHg  Pulse 63  Temp(Src) 98 F (36.7 C) (Oral)  Resp 18  SpO2 98% Physical Exam  Constitutional: She is oriented to person, place, and time. She appears well-developed and well-nourished. No distress.  HENT:  Mouth/Throat: Oropharynx is clear and moist.  Eyes: Conjunctivae are normal. No scleral icterus.  Neck: Neck supple. No tracheal deviation present.  Cardiovascular: Normal rate, regular rhythm, normal heart sounds and intact distal pulses.   Pulmonary/Chest: Effort normal and breath sounds normal. No respiratory distress.  Abdominal: Soft. Normal appearance. She exhibits no distension. There is no tenderness.  Musculoskeletal:  Mild symmetric bil ankle edema.  Neurological: She is alert and oriented to person, place, and time.  Speech clear, fluent. Motor intact bil, sens grossly intact.   Skin: Skin is warm and dry. No rash noted.  Psychiatric: She has a normal mood and affect.  Nursing note and vitals reviewed.   ED Course  Procedures (including critical care time) Labs Review    MDM   Pt states she got concerned as bp was high today on home monitor.  Pt has not taken any of her bp meds today.  Pt given her normal dose of her bp meds.   bp high, but not critically so, or requiring emergent rx (150/57).  Reviewed nursing notes and prior charts for additional history.   Discussed close pcp f/u.  Return precautions provided.    Lajean Saver, MD 01/30/15 1245

## 2015-01-30 NOTE — ED Notes (Signed)
Per EMS pt complaint of hypertension; took last BP medication yesterday. Pt neuro unremarkable.

## 2015-03-29 ENCOUNTER — Telehealth: Payer: Self-pay | Admitting: *Deleted

## 2015-03-29 NOTE — Telephone Encounter (Signed)
Pt request appt with Dr. Milinda Pointer.

## 2015-04-28 DIAGNOSIS — M109 Gout, unspecified: Secondary | ICD-10-CM | POA: Diagnosis not present

## 2015-04-28 DIAGNOSIS — M810 Age-related osteoporosis without current pathological fracture: Secondary | ICD-10-CM | POA: Diagnosis not present

## 2015-04-28 DIAGNOSIS — I10 Essential (primary) hypertension: Secondary | ICD-10-CM | POA: Diagnosis not present

## 2015-04-28 DIAGNOSIS — E039 Hypothyroidism, unspecified: Secondary | ICD-10-CM | POA: Diagnosis not present

## 2015-04-28 DIAGNOSIS — N390 Urinary tract infection, site not specified: Secondary | ICD-10-CM | POA: Diagnosis not present

## 2015-04-28 DIAGNOSIS — R8290 Unspecified abnormal findings in urine: Secondary | ICD-10-CM | POA: Diagnosis not present

## 2015-05-05 DIAGNOSIS — M810 Age-related osteoporosis without current pathological fracture: Secondary | ICD-10-CM | POA: Diagnosis not present

## 2015-05-05 DIAGNOSIS — Z1389 Encounter for screening for other disorder: Secondary | ICD-10-CM | POA: Diagnosis not present

## 2015-05-05 DIAGNOSIS — I10 Essential (primary) hypertension: Secondary | ICD-10-CM | POA: Diagnosis not present

## 2015-05-05 DIAGNOSIS — Z6824 Body mass index (BMI) 24.0-24.9, adult: Secondary | ICD-10-CM | POA: Diagnosis not present

## 2015-05-05 DIAGNOSIS — I8290 Acute embolism and thrombosis of unspecified vein: Secondary | ICD-10-CM | POA: Diagnosis not present

## 2015-05-05 DIAGNOSIS — M109 Gout, unspecified: Secondary | ICD-10-CM | POA: Diagnosis not present

## 2015-05-05 DIAGNOSIS — M179 Osteoarthritis of knee, unspecified: Secondary | ICD-10-CM | POA: Diagnosis not present

## 2015-05-05 DIAGNOSIS — Z0000 Encounter for general adult medical examination without abnormal findings: Secondary | ICD-10-CM | POA: Diagnosis not present

## 2015-05-05 DIAGNOSIS — E039 Hypothyroidism, unspecified: Secondary | ICD-10-CM | POA: Diagnosis not present

## 2015-05-10 DIAGNOSIS — E784 Other hyperlipidemia: Secondary | ICD-10-CM | POA: Diagnosis not present

## 2015-05-10 DIAGNOSIS — N390 Urinary tract infection, site not specified: Secondary | ICD-10-CM | POA: Diagnosis not present

## 2015-05-10 DIAGNOSIS — E039 Hypothyroidism, unspecified: Secondary | ICD-10-CM | POA: Diagnosis not present

## 2015-05-10 DIAGNOSIS — E559 Vitamin D deficiency, unspecified: Secondary | ICD-10-CM | POA: Diagnosis not present

## 2015-05-26 DIAGNOSIS — H5212 Myopia, left eye: Secondary | ICD-10-CM | POA: Diagnosis not present

## 2015-05-26 DIAGNOSIS — H2511 Age-related nuclear cataract, right eye: Secondary | ICD-10-CM | POA: Diagnosis not present

## 2015-05-26 DIAGNOSIS — H5201 Hypermetropia, right eye: Secondary | ICD-10-CM | POA: Diagnosis not present

## 2015-07-05 DIAGNOSIS — E034 Atrophy of thyroid (acquired): Secondary | ICD-10-CM | POA: Diagnosis not present

## 2015-07-05 DIAGNOSIS — E784 Other hyperlipidemia: Secondary | ICD-10-CM | POA: Diagnosis not present

## 2015-07-05 DIAGNOSIS — E559 Vitamin D deficiency, unspecified: Secondary | ICD-10-CM | POA: Diagnosis not present

## 2015-07-05 DIAGNOSIS — R072 Precordial pain: Secondary | ICD-10-CM | POA: Diagnosis not present

## 2015-07-05 DIAGNOSIS — I5022 Chronic systolic (congestive) heart failure: Secondary | ICD-10-CM | POA: Diagnosis not present

## 2015-07-10 DIAGNOSIS — E039 Hypothyroidism, unspecified: Secondary | ICD-10-CM | POA: Diagnosis not present

## 2015-07-10 DIAGNOSIS — E785 Hyperlipidemia, unspecified: Secondary | ICD-10-CM | POA: Diagnosis not present

## 2015-07-10 DIAGNOSIS — E876 Hypokalemia: Secondary | ICD-10-CM | POA: Diagnosis not present

## 2015-07-10 DIAGNOSIS — Z79899 Other long term (current) drug therapy: Secondary | ICD-10-CM | POA: Diagnosis not present

## 2015-07-10 DIAGNOSIS — E559 Vitamin D deficiency, unspecified: Secondary | ICD-10-CM | POA: Diagnosis not present

## 2015-07-10 DIAGNOSIS — D649 Anemia, unspecified: Secondary | ICD-10-CM | POA: Diagnosis not present

## 2015-07-18 ENCOUNTER — Ambulatory Visit (HOSPITAL_COMMUNITY)
Admission: RE | Admit: 2015-07-18 | Discharge: 2015-07-18 | Disposition: A | Discharge status: Home / Self Care | Payer: Medicare Other | Source: Ambulatory Visit | Attending: Internal Medicine | Admitting: Internal Medicine

## 2015-07-24 DIAGNOSIS — M25562 Pain in left knee: Secondary | ICD-10-CM | POA: Diagnosis not present

## 2015-07-24 DIAGNOSIS — M25561 Pain in right knee: Secondary | ICD-10-CM | POA: Diagnosis not present

## 2015-07-24 DIAGNOSIS — M170 Bilateral primary osteoarthritis of knee: Secondary | ICD-10-CM | POA: Diagnosis not present

## 2015-07-24 DIAGNOSIS — R269 Unspecified abnormalities of gait and mobility: Secondary | ICD-10-CM | POA: Diagnosis not present

## 2015-07-31 DIAGNOSIS — M25562 Pain in left knee: Secondary | ICD-10-CM | POA: Diagnosis not present

## 2015-07-31 DIAGNOSIS — M1711 Unilateral primary osteoarthritis, right knee: Secondary | ICD-10-CM | POA: Diagnosis not present

## 2015-07-31 DIAGNOSIS — M25561 Pain in right knee: Secondary | ICD-10-CM | POA: Diagnosis not present

## 2015-07-31 DIAGNOSIS — M170 Bilateral primary osteoarthritis of knee: Secondary | ICD-10-CM | POA: Diagnosis not present

## 2015-07-31 DIAGNOSIS — R262 Difficulty in walking, not elsewhere classified: Secondary | ICD-10-CM | POA: Diagnosis not present

## 2015-07-31 DIAGNOSIS — R2689 Other abnormalities of gait and mobility: Secondary | ICD-10-CM | POA: Diagnosis not present

## 2015-08-02 DIAGNOSIS — R2689 Other abnormalities of gait and mobility: Secondary | ICD-10-CM | POA: Diagnosis not present

## 2015-08-02 DIAGNOSIS — M25562 Pain in left knee: Secondary | ICD-10-CM | POA: Diagnosis not present

## 2015-08-02 DIAGNOSIS — E034 Atrophy of thyroid (acquired): Secondary | ICD-10-CM | POA: Diagnosis not present

## 2015-08-02 DIAGNOSIS — E559 Vitamin D deficiency, unspecified: Secondary | ICD-10-CM | POA: Diagnosis not present

## 2015-08-02 DIAGNOSIS — M1712 Unilateral primary osteoarthritis, left knee: Secondary | ICD-10-CM | POA: Diagnosis not present

## 2015-08-02 DIAGNOSIS — E784 Other hyperlipidemia: Secondary | ICD-10-CM | POA: Diagnosis not present

## 2015-08-02 DIAGNOSIS — I5022 Chronic systolic (congestive) heart failure: Secondary | ICD-10-CM | POA: Diagnosis not present

## 2015-08-02 DIAGNOSIS — M170 Bilateral primary osteoarthritis of knee: Secondary | ICD-10-CM | POA: Diagnosis not present

## 2015-08-02 DIAGNOSIS — F411 Generalized anxiety disorder: Secondary | ICD-10-CM | POA: Diagnosis not present

## 2015-08-02 DIAGNOSIS — M25561 Pain in right knee: Secondary | ICD-10-CM | POA: Diagnosis not present

## 2015-08-09 DIAGNOSIS — M25561 Pain in right knee: Secondary | ICD-10-CM | POA: Diagnosis not present

## 2015-08-09 DIAGNOSIS — R2689 Other abnormalities of gait and mobility: Secondary | ICD-10-CM | POA: Diagnosis not present

## 2015-08-09 DIAGNOSIS — M170 Bilateral primary osteoarthritis of knee: Secondary | ICD-10-CM | POA: Diagnosis not present

## 2015-08-09 DIAGNOSIS — M25562 Pain in left knee: Secondary | ICD-10-CM | POA: Diagnosis not present

## 2015-08-09 DIAGNOSIS — M1711 Unilateral primary osteoarthritis, right knee: Secondary | ICD-10-CM | POA: Diagnosis not present

## 2015-08-14 DIAGNOSIS — R2689 Other abnormalities of gait and mobility: Secondary | ICD-10-CM | POA: Diagnosis not present

## 2015-08-14 DIAGNOSIS — M1712 Unilateral primary osteoarthritis, left knee: Secondary | ICD-10-CM | POA: Diagnosis not present

## 2015-08-14 DIAGNOSIS — M25561 Pain in right knee: Secondary | ICD-10-CM | POA: Diagnosis not present

## 2015-08-14 DIAGNOSIS — M25562 Pain in left knee: Secondary | ICD-10-CM | POA: Diagnosis not present

## 2015-08-14 DIAGNOSIS — M170 Bilateral primary osteoarthritis of knee: Secondary | ICD-10-CM | POA: Diagnosis not present

## 2015-08-16 DIAGNOSIS — R2689 Other abnormalities of gait and mobility: Secondary | ICD-10-CM | POA: Diagnosis not present

## 2015-08-16 DIAGNOSIS — M25562 Pain in left knee: Secondary | ICD-10-CM | POA: Diagnosis not present

## 2015-08-16 DIAGNOSIS — M170 Bilateral primary osteoarthritis of knee: Secondary | ICD-10-CM | POA: Diagnosis not present

## 2015-08-16 DIAGNOSIS — M1711 Unilateral primary osteoarthritis, right knee: Secondary | ICD-10-CM | POA: Diagnosis not present

## 2015-08-16 DIAGNOSIS — M25561 Pain in right knee: Secondary | ICD-10-CM | POA: Diagnosis not present

## 2015-08-21 ENCOUNTER — Encounter (HOSPITAL_COMMUNITY): Payer: Self-pay | Admitting: General Practice

## 2015-08-21 ENCOUNTER — Emergency Department (HOSPITAL_COMMUNITY)
Admission: EM | Admit: 2015-08-21 | Discharge: 2015-08-21 | Disposition: A | Discharge status: Home / Self Care | Payer: Medicare Other | Attending: Emergency Medicine | Admitting: Emergency Medicine

## 2015-08-21 DIAGNOSIS — E039 Hypothyroidism, unspecified: Secondary | ICD-10-CM | POA: Diagnosis not present

## 2015-08-21 DIAGNOSIS — M109 Gout, unspecified: Secondary | ICD-10-CM | POA: Insufficient documentation

## 2015-08-21 DIAGNOSIS — I159 Secondary hypertension, unspecified: Secondary | ICD-10-CM | POA: Diagnosis not present

## 2015-08-21 DIAGNOSIS — M170 Bilateral primary osteoarthritis of knee: Secondary | ICD-10-CM | POA: Diagnosis not present

## 2015-08-21 DIAGNOSIS — I2510 Atherosclerotic heart disease of native coronary artery without angina pectoris: Secondary | ICD-10-CM | POA: Diagnosis not present

## 2015-08-21 DIAGNOSIS — Z79899 Other long term (current) drug therapy: Secondary | ICD-10-CM | POA: Diagnosis not present

## 2015-08-21 DIAGNOSIS — E079 Disorder of thyroid, unspecified: Secondary | ICD-10-CM | POA: Diagnosis not present

## 2015-08-21 DIAGNOSIS — Z7982 Long term (current) use of aspirin: Secondary | ICD-10-CM | POA: Diagnosis not present

## 2015-08-21 DIAGNOSIS — R2689 Other abnormalities of gait and mobility: Secondary | ICD-10-CM | POA: Diagnosis not present

## 2015-08-21 DIAGNOSIS — R030 Elevated blood-pressure reading, without diagnosis of hypertension: Secondary | ICD-10-CM | POA: Diagnosis not present

## 2015-08-21 DIAGNOSIS — M25561 Pain in right knee: Secondary | ICD-10-CM | POA: Diagnosis not present

## 2015-08-21 DIAGNOSIS — M1712 Unilateral primary osteoarthritis, left knee: Secondary | ICD-10-CM | POA: Diagnosis not present

## 2015-08-21 DIAGNOSIS — M25562 Pain in left knee: Secondary | ICD-10-CM | POA: Diagnosis not present

## 2015-08-21 DIAGNOSIS — I509 Heart failure, unspecified: Secondary | ICD-10-CM | POA: Diagnosis not present

## 2015-08-21 DIAGNOSIS — R002 Palpitations: Secondary | ICD-10-CM | POA: Diagnosis present

## 2015-08-21 LAB — CBC WITH DIFFERENTIAL/PLATELET
Basophils Absolute: 0 10*3/uL (ref 0.0–0.1)
Basophils Relative: 0 %
Eosinophils Absolute: 0.1 10*3/uL (ref 0.0–0.7)
Eosinophils Relative: 2 %
HCT: 37.3 % (ref 36.0–46.0)
Hemoglobin: 12.3 g/dL (ref 12.0–15.0)
LYMPHS PCT: 49 %
Lymphs Abs: 1.5 10*3/uL (ref 0.7–4.0)
MCH: 30.8 pg (ref 26.0–34.0)
MCHC: 33 g/dL (ref 30.0–36.0)
MCV: 93.5 fL (ref 78.0–100.0)
Monocytes Absolute: 0.2 10*3/uL (ref 0.1–1.0)
Monocytes Relative: 7 %
NEUTROS ABS: 1.3 10*3/uL — AB (ref 1.7–7.7)
NEUTROS PCT: 42 %
Platelets: 205 10*3/uL (ref 150–400)
RBC: 3.99 MIL/uL (ref 3.87–5.11)
RDW: 13.1 % (ref 11.5–15.5)
WBC: 3 10*3/uL — AB (ref 4.0–10.5)

## 2015-08-21 LAB — BASIC METABOLIC PANEL
Anion gap: 6 (ref 5–15)
BUN: 12 mg/dL (ref 6–20)
CALCIUM: 9.4 mg/dL (ref 8.9–10.3)
CHLORIDE: 102 mmol/L (ref 101–111)
CO2: 31 mmol/L (ref 22–32)
CREATININE: 0.91 mg/dL (ref 0.44–1.00)
GFR calc non Af Amer: 51 mL/min — ABNORMAL LOW (ref >60)
GFR, EST AFRICAN AMERICAN: 59 mL/min — AB (ref >60)
Glucose, Bld: 82 mg/dL (ref 65–99)
Potassium: 4.8 mmol/L (ref 3.5–5.1)
SODIUM: 139 mmol/L (ref 135–145)

## 2015-08-21 LAB — MAGNESIUM: MAGNESIUM: 1.6 mg/dL — AB (ref 1.7–2.4)

## 2015-08-21 MED ORDER — METOPROLOL SUCCINATE ER 25 MG PO TB24
25.0000 mg | ORAL_TABLET | Freq: Every day | ORAL | Status: DC
  Administered 2015-08-21: 25 mg via ORAL
  Filled 2015-08-21: qty 1

## 2015-08-21 MED ORDER — MAGNESIUM OXIDE 400 (241.3 MG) MG PO TABS
800.0000 mg | ORAL_TABLET | Freq: Once | ORAL | Status: AC
  Administered 2015-08-21: 800 mg via ORAL
  Filled 2015-08-21: qty 2

## 2015-08-21 MED ORDER — LOSARTAN POTASSIUM 50 MG PO TABS
50.0000 mg | ORAL_TABLET | Freq: Every day | ORAL | Status: DC
  Administered 2015-08-21: 50 mg via ORAL
  Filled 2015-08-21 (×2): qty 1

## 2015-08-21 NOTE — ED Notes (Signed)
Pt arrives from dr's office via [facility] c/o "feeling jittery", palpitations.  Pt reports waking up with same feeling, reports driving self to PT and then to dr's office for injection.  Dr's office called 911.  Pt denies CP, n/v, LOC.  PT AOx4, NAD noted at this time.

## 2015-08-21 NOTE — ED Provider Notes (Signed)
CSN: [id number]     Arrival date & time 08/21/15  1342 History   First MD Initiated Contact with Patient 08/21/15 1354     Chief Complaint  Patient presents with  . Palpitations     (Consider location/radiation/quality/duration/timing/severity/associated sxs/prior Treatment) Patient is a 81 y.o. female presenting with palpitations. The history is provided by the patient.  Palpitations Palpitations quality:  Regular Onset quality:  Sudden Duration:  2 hours Timing:  Rare Progression:  Resolved Chronicity:  New Relieved by:  Nothing Worsened by:  Nothing Ineffective treatments:  None tried Associated symptoms: no chest pain, no dizziness, no nausea, no shortness of breath and no vomiting   Risk factors: no hx of atrial fibrillation    81 yo F with a chief complaint of hypertension. Patient has a history of the same and did not take her medications this morning. Patient had physical therapy followed by joint injection of her right knee. Patient felt that she didn't have time to take her thyroid medicine and then wait the allotted amount of time to then take her blood pressure medications. Denies any current symptoms. Did have transient palpitations that woke her up from sleep. These lasted for about 2 hours. Denies current palpitations. Denies chest pain shortness breath abdominal pain back pain. Having some mild chronic pain to her knees but denies other symptoms.   Past Medical History  Diagnosis Date  . Hypertension   . Gout   . Thyroid disease   . Coronary artery disease   . Arthritis   . CHF (congestive heart failure) ([place])   . Hypothyroidism   . Shortness of breath   . Osteoporosis    Past Surgical History  Procedure Laterality Date  . Abdominal hysterectomy    . Appendectomy    . Tonsillectomy    . Carpal tunnel release Right    No family history on file. Social History  Substance Use Topics  . Smoking status: Never Smoker   . Smokeless tobacco: Never Used  .  Alcohol Use: No   OB History    No data available     Review of Systems  Constitutional: Negative for fever and chills.  HENT: Negative for congestion and rhinorrhea.   Eyes: Negative for redness and visual disturbance.  Respiratory: Negative for shortness of breath and wheezing.   Cardiovascular: Positive for palpitations. Negative for chest pain.  Gastrointestinal: Negative for nausea and vomiting.  Genitourinary: Negative for dysuria and urgency.  Musculoskeletal: Negative for myalgias and arthralgias.  Skin: Negative for pallor and wound.  Neurological: Negative for dizziness and headaches.      Allergies  Crestor  Home Medications   Prior to Admission medications   Medication Sig Start Date End Date Taking? Authorizing Provider  acetaminophen (TYLENOL) 325 MG tablet Take 650 mg by mouth every 6 (six) hours as needed for mild pain.    Historical Provider, MD  aspirin 81 MG chewable tablet Chew 81 mg by mouth daily.    Historical Provider, MD  Cholecalciferol (VITAMIN D PO) Take 1 tablet by mouth daily as needed (Dietary supplement).     Historical Provider, MD  denosumab (PROLIA) 60 MG/ML SOLN injection Inject 60 mg into the skin every 6 (six) months. Administer in upper arm, thigh, or abdomen    Historical Provider, MD  furosemide (LASIX) 40 MG tablet Take 1 tablet by mouth daily as needed for fluid or edema.  01/17/15   Historical Provider, MD  isosorbide mononitrate (IMDUR) 30 MG 24 hr  tablet Take 30 mg by mouth daily. 12/23/13   Historical Provider, MD  levothyroxine (SYNTHROID, LEVOTHROID) 75 MCG tablet Take 75 mcg by mouth daily before breakfast.    Historical Provider, MD  losartan (COZAAR) 50 MG tablet Take 50 mg by mouth daily.    Historical Provider, MD  Menthol-Zinc Oxide (GOLD BOND EX) Apply 1 application topically daily as needed (itching).    Historical Provider, MD  metoprolol succinate (TOPROL-XL) 25 MG 24 hr tablet Take 25 mg by mouth daily.    Historical  Provider, MD  Polyethyl Glycol-Propyl Glycol (SYSTANE OP) Apply 1-2 drops to eye daily as needed (for eye dryness).    Historical Provider, MD  potassium chloride (K-DUR,KLOR-CON) 10 MEQ tablet Take 10 mEq by mouth daily.     Historical Provider, MD   BP 199/74 mmHg  Pulse 56  Temp(Src) 97.5 F (36.4 C) (Oral)  Resp 15  Ht 5\' 5"  (1.651 m)  Wt 130 lb (58.968 kg)  BMI 21.63 kg/m2  SpO2 100% Physical Exam  Constitutional: She is oriented to person, place, and time. She appears well-developed and well-nourished. No distress.  HENT:  Head: Normocephalic and atraumatic.  Eyes: EOM are normal. Pupils are equal, round, and reactive to light.  Neck: Normal range of motion. Neck supple.  Cardiovascular: Normal rate and regular rhythm.  Exam reveals no gallop and no friction rub.   No murmur heard. Pulmonary/Chest: Effort normal. She has no wheezes. She has no rales.  Abdominal: Soft. She exhibits no distension. There is no tenderness. There is no rebound.  Musculoskeletal: She exhibits no edema or tenderness.  Neurological: She is alert and oriented to person, place, and time.  Skin: Skin is warm and dry. She is not diaphoretic.  Psychiatric: She has a normal mood and affect. Her behavior is normal.  Nursing note and vitals reviewed.   ED Course  Procedures (including critical care time) Labs Review Labs Reviewed  CBC WITH DIFFERENTIAL/PLATELET - Abnormal; Notable for the following:    WBC 3.0 (*)    Neutro Abs 1.3 (*)    All other components within normal limits  BASIC METABOLIC PANEL - Abnormal; Notable for the following:    GFR calc non Af Amer 51 (*)    GFR calc Af Amer 59 (*)    All other components within normal limits  MAGNESIUM - Abnormal; Notable for the following:    Magnesium 1.6 (*)    All other components within normal limits    Imaging Review No results found. I have personally reviewed and evaluated these images and lab results as part of my medical  decision-making.   EKG Interpretation   Date/Time:  Monday August 21 2015 13:45:28 EST Ventricular Rate:  61 PR Interval:  191 QRS Duration: 85 QT Interval:  403 QTC Calculation: 406 R Axis:   57 Text Interpretation:  Sinus rhythm Baseline wander in lead(s) II Otherwise  no significant change Confirmed by Tykeshia Tourangeau MD, Quillian Quince [id number]) on 08/21/2015  1:54:34 PM      MDM   Final diagnoses:  Secondary hypertension, unspecified  Hypomagnesemia    81 yo F with a chief complaint of hypertension. Patient did not take her medicines this morning. We'll give her her medications now. Laboratory evaluation with low magnesium. Will replenish in the ED. EKG unremarkable. With no symptoms suggest PCP follow up.  2:42 PM:  I have discussed the diagnosis/risks/treatment options with the patient and believe the pt to be eligible for discharge home to follow-up  with PCP. We also discussed returning to the ED immediately if new or worsening sx occur. We discussed the sx which are most concerning (e.g., sudden worsening pain, fever, inability to tolerate by mouth) that necessitate immediate return. Medications administered to the patient during their visit and any new prescriptions provided to the patient are listed below.  Medications given during this visit Medications  losartan (COZAAR) tablet 50 mg (not administered)  metoprolol succinate (TOPROL-XL) 24 hr tablet 25 mg (not administered)    New Prescriptions   No medications on file    The patient appears reasonably screen and/or stabilized for discharge and I doubt any other medical condition or other [facility] requiring further screening, evaluation, or treatment in the ED at this time prior to discharge.      Deno Etienne, DO 08/21/15 2012

## 2015-08-21 NOTE — Discharge Instructions (Signed)
How to Take Your Blood Pressure °HOW DO I GET A BLOOD PRESSURE MACHINE? °· You can buy an electronic home blood pressure machine at your local pharmacy. Insurance will sometimes cover the cost if you have a prescription. °· Ask your doctor what type of machine is best for you. There are different machines for your arm and your wrist. °· If you decide to buy a machine to check your blood pressure on your arm, first check the size of your arm so you can buy the right size cuff. To check the size of your arm:   °¨ Use a measuring tape that shows both inches and centimeters.   °¨ Wrap the measuring tape around the upper-middle part of your arm. You may need someone to help you measure.   °¨ Write down your arm measurement in both inches and centimeters.   °· To measure your blood pressure correctly, it is important to have the right size cuff.   °¨ If your arm is up to 13 inches (up to 34 centimeters), get an adult cuff size. °¨ If your arm is 13 to 17 inches (35 to 44 centimeters), get a large adult cuff size.   °¨  If your arm is 17 to 20 inches (45 to 52 centimeters), get an adult thigh cuff.   °WHAT DO THE NUMBERS MEAN?  °· There are two numbers that make up your blood pressure. For example: 120/80. °¨ The first number (120 in our example) is called the "systolic pressure." It is a measure of the pressure in your blood vessels when your heart is pumping blood. °¨ The second number (80 in our example) is called the "diastolic pressure." It is a measure of the pressure in your blood vessels when your heart is resting between beats. °· Your doctor will tell you what your blood pressure should be. °WHAT SHOULD I DO BEFORE I CHECK MY BLOOD PRESSURE?  °· Try to rest or relax for at least 30 minutes before you check your blood pressure. °· Do not smoke. °· Do not have any drinks with caffeine, such as: °¨ Soda. °¨ Coffee. °¨ Tea. °· Check your blood pressure in a quiet room. °· Sit down and stretch out your arm on a table.  Keep your arm at about the level of your heart. Let your arm relax. °· Make sure that your legs are not crossed. °HOW DO I CHECK MY BLOOD PRESSURE? °· Follow the directions that came with your machine. °· Make sure you remove any tight-fitting clothing from your arm or wrist. Wrap the cuff around your upper arm or wrist. You should be able to fit a finger between the cuff and your arm. If you cannot fit a finger between the cuff and your arm, it is too tight and should be removed and rewrapped. °· Some units require you to manually pump up the arm cuff. °· Automatic units inflate the cuff when you press a button. °· Cuff deflation is automatic in both models. °· After the cuff is inflated, the unit measures your blood pressure and pulse. The readings are shown on a monitor. Hold still and breathe normally while the cuff is inflated. °· Getting a reading takes less than a minute. °· Some models store readings in a memory. Some provide a printout of readings. If your machine does not store your readings, keep a written record. °· Take readings with you to your next visit with your doctor. °  °This information is not intended to replace advice given to   you by your health care provider. Make sure you discuss any questions you have with your health care provider. °  °Document Released: 08/29/2008 Document Revised: 10/07/2014 Document Reviewed: 11/11/2013 °Elsevier Interactive Patient Education ©2016 [employer] ° °

## 2015-08-28 DIAGNOSIS — M170 Bilateral primary osteoarthritis of knee: Secondary | ICD-10-CM | POA: Diagnosis not present

## 2015-08-28 DIAGNOSIS — R2689 Other abnormalities of gait and mobility: Secondary | ICD-10-CM | POA: Diagnosis not present

## 2015-08-28 DIAGNOSIS — M25561 Pain in right knee: Secondary | ICD-10-CM | POA: Diagnosis not present

## 2015-08-28 DIAGNOSIS — M1711 Unilateral primary osteoarthritis, right knee: Secondary | ICD-10-CM | POA: Diagnosis not present

## 2015-08-28 DIAGNOSIS — M25562 Pain in left knee: Secondary | ICD-10-CM | POA: Diagnosis not present

## 2015-08-30 DIAGNOSIS — M25562 Pain in left knee: Secondary | ICD-10-CM | POA: Diagnosis not present

## 2015-08-30 DIAGNOSIS — R2689 Other abnormalities of gait and mobility: Secondary | ICD-10-CM | POA: Diagnosis not present

## 2015-08-30 DIAGNOSIS — M25561 Pain in right knee: Secondary | ICD-10-CM | POA: Diagnosis not present

## 2015-08-30 DIAGNOSIS — M170 Bilateral primary osteoarthritis of knee: Secondary | ICD-10-CM | POA: Diagnosis not present

## 2015-08-30 DIAGNOSIS — M1712 Unilateral primary osteoarthritis, left knee: Secondary | ICD-10-CM | POA: Diagnosis not present

## 2015-09-21 IMAGING — DX DG CHEST 2V
2 series · 2 of 2 positions shown · non-contrast
Comparison: 01/09/2014

CLINICAL DATA: Chest pain, tachycardia, and left arm pain.

EXAM:
CHEST  2 VIEW

[chest pa]
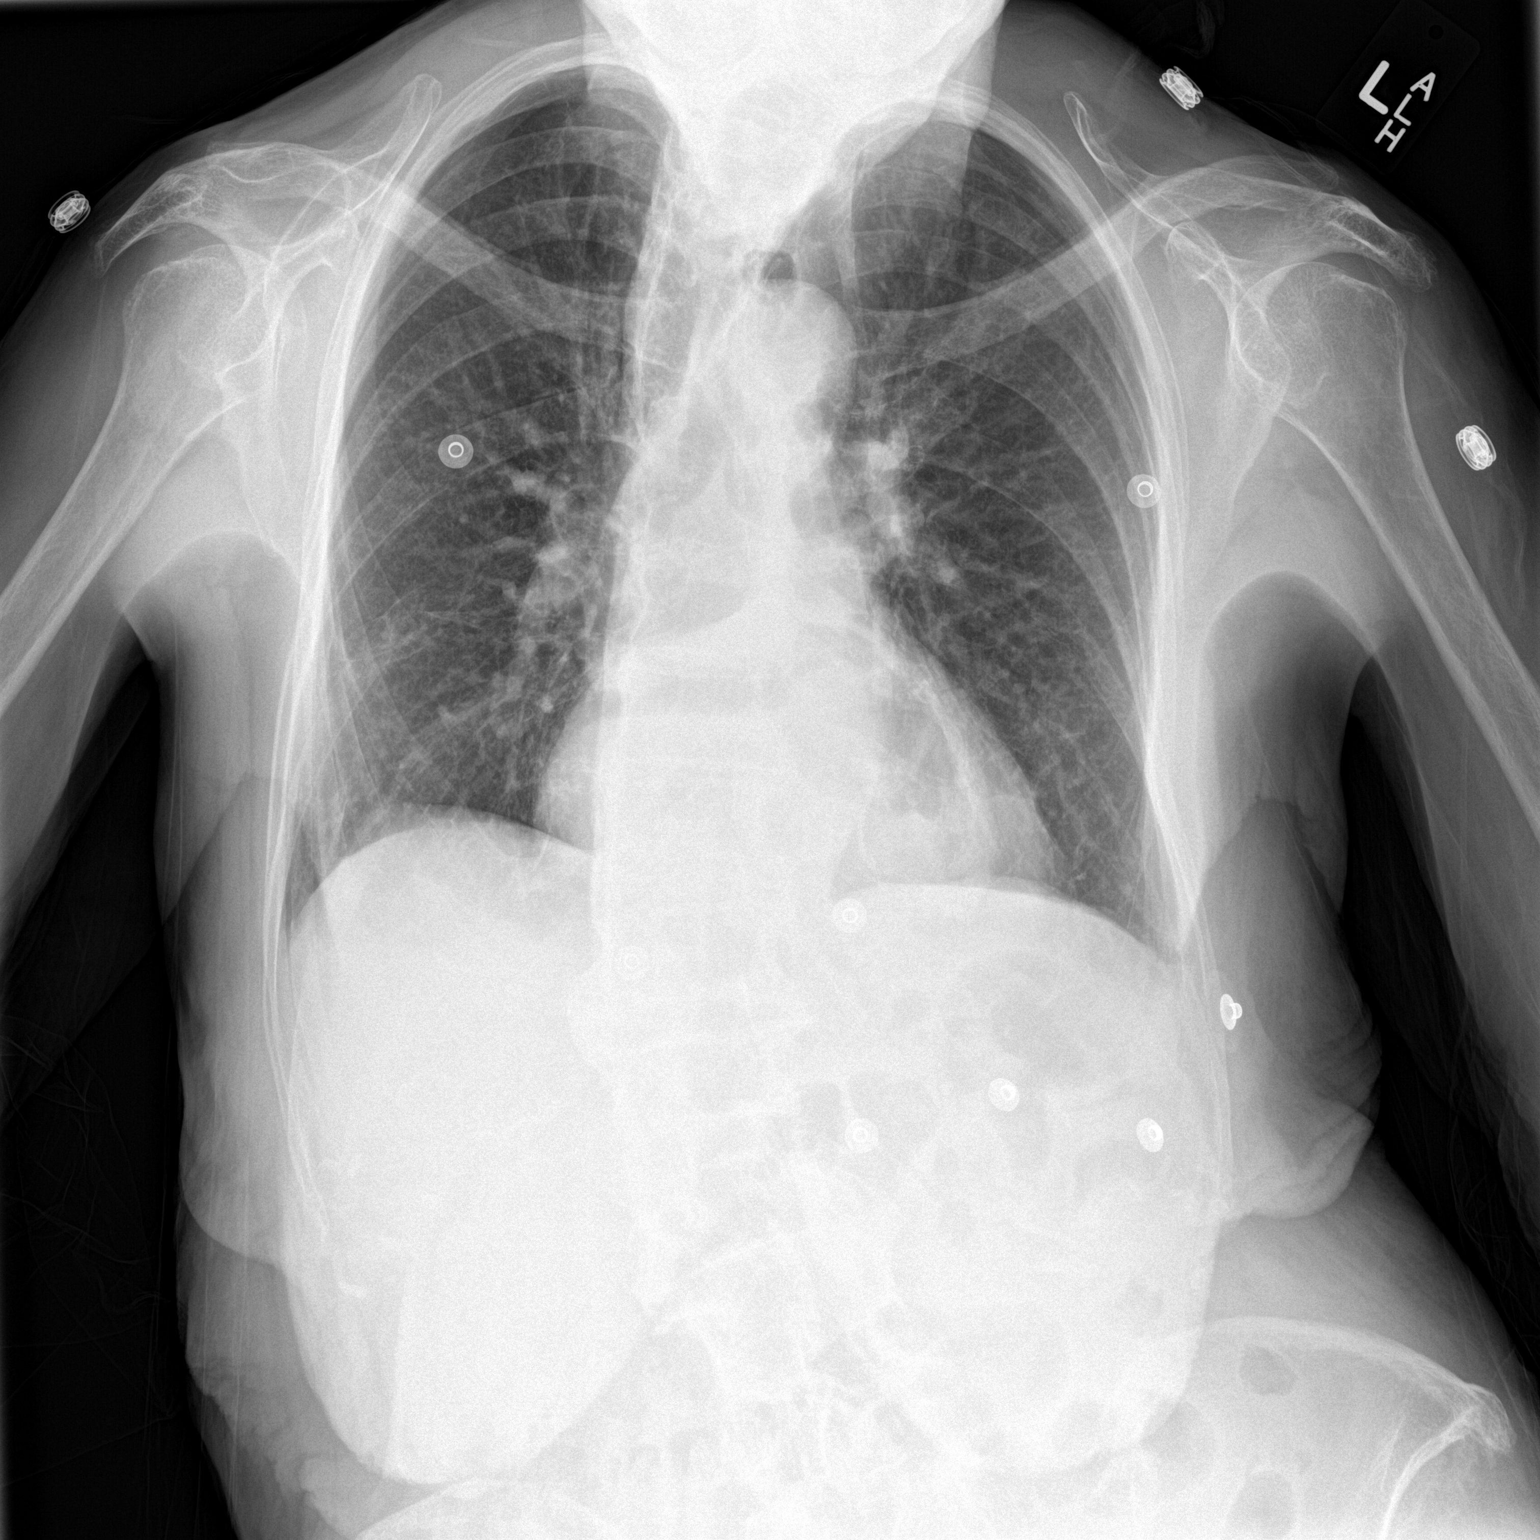

[chest lat]
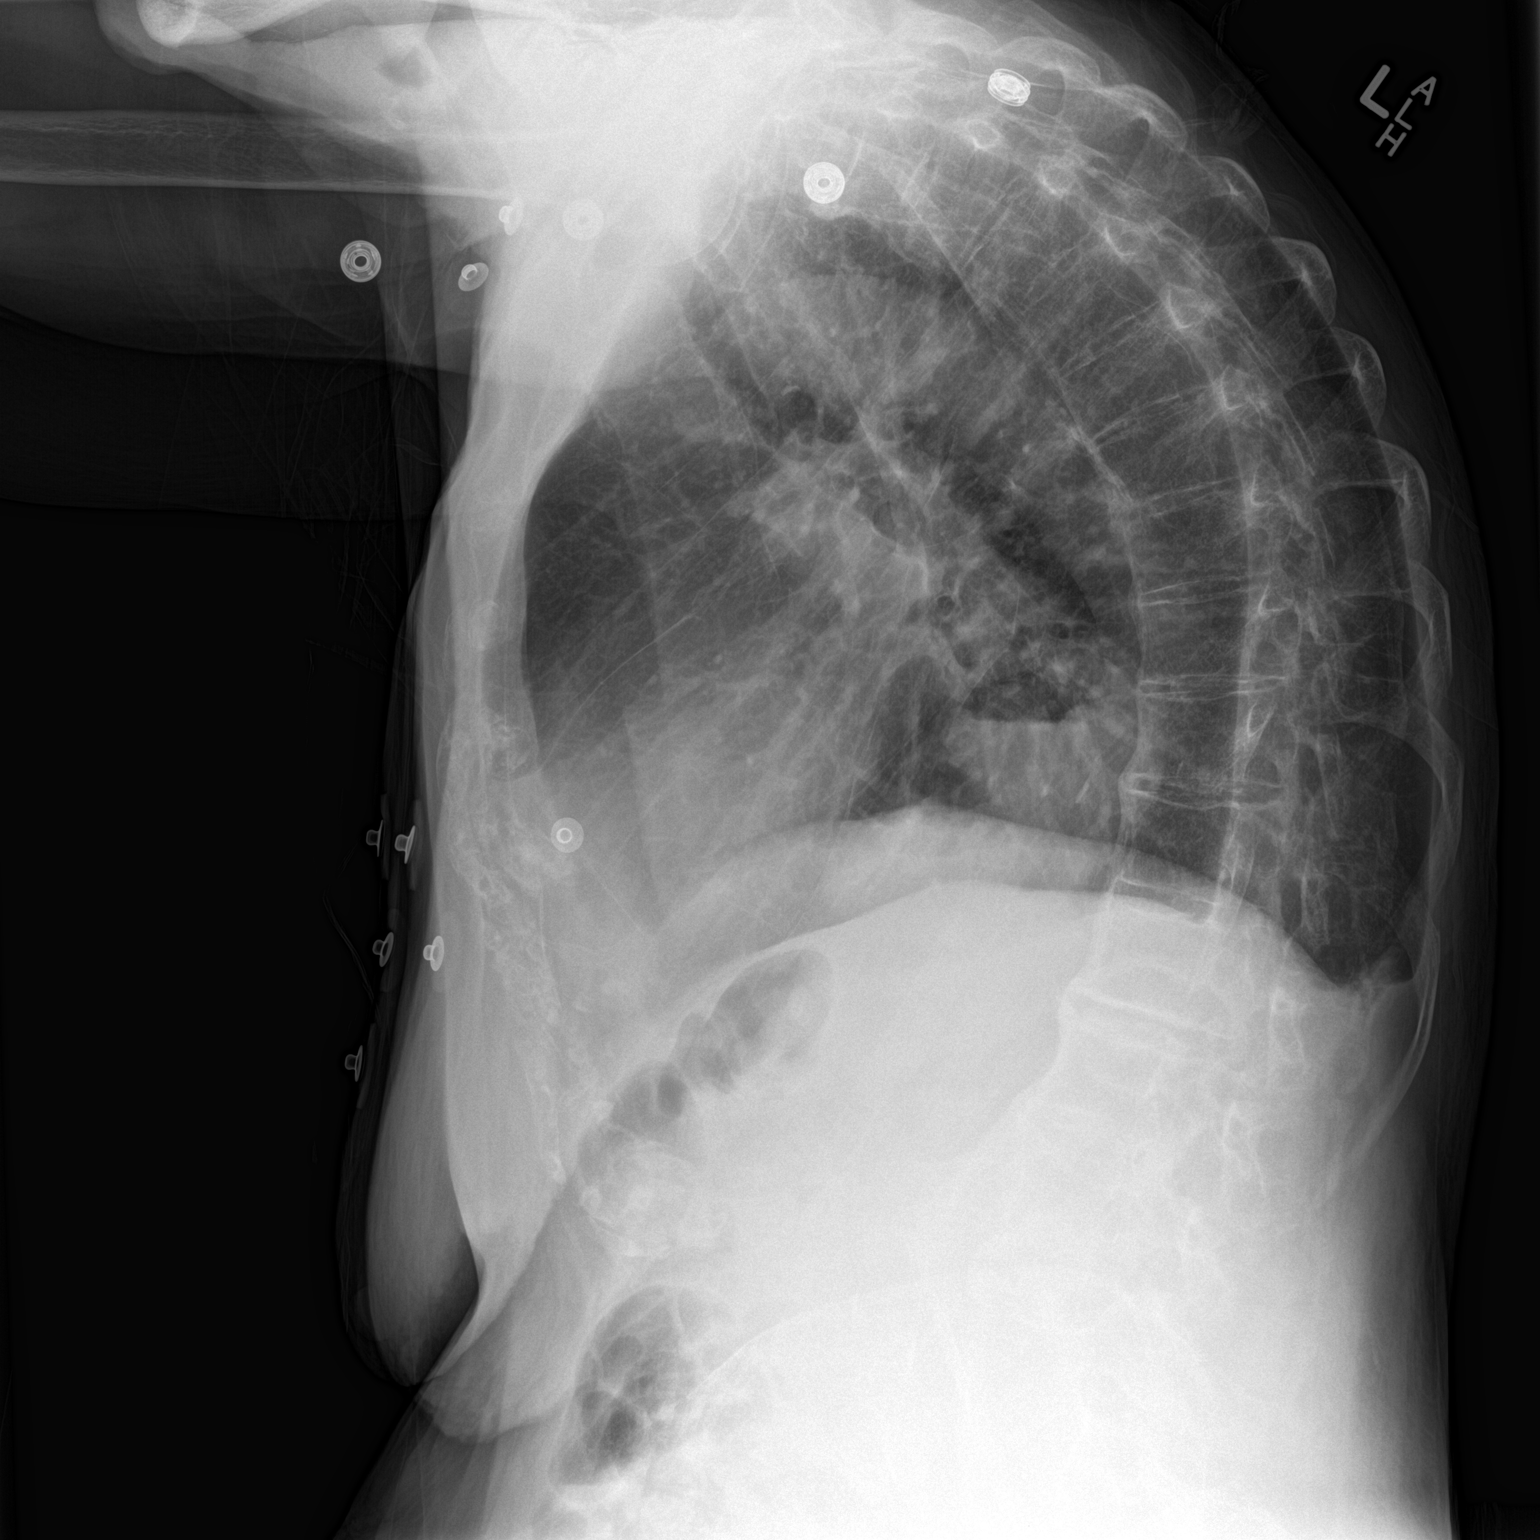

[2 of 2 positions shown; findings below may reference images not displayed]

FINDINGS: Cardiac silhouette is within normal limits for size. A large hiatal
hernia is again seen. Mild, chronic interstitial prominence does not
appear significantly changed. Trace bilateral pleural effusions are
questioned. No confluent airspace opacity, pulmonary edema, or
pneumothorax is identified. No acute osseous abnormality is
identified.
IMPRESSION: 1. Possible trace pleural effusions.
2. No evidence of pneumonia.
3. Large hiatal hernia.

## 2015-10-17 DIAGNOSIS — I5022 Chronic systolic (congestive) heart failure: Secondary | ICD-10-CM | POA: Diagnosis not present

## 2015-10-17 DIAGNOSIS — E034 Atrophy of thyroid (acquired): Secondary | ICD-10-CM | POA: Diagnosis not present

## 2015-10-17 DIAGNOSIS — M25561 Pain in right knee: Secondary | ICD-10-CM | POA: Diagnosis not present

## 2015-10-17 DIAGNOSIS — E559 Vitamin D deficiency, unspecified: Secondary | ICD-10-CM | POA: Diagnosis not present

## 2015-10-17 DIAGNOSIS — M25562 Pain in left knee: Secondary | ICD-10-CM | POA: Diagnosis not present

## 2015-10-17 DIAGNOSIS — R072 Precordial pain: Secondary | ICD-10-CM | POA: Diagnosis not present

## 2015-11-01 DIAGNOSIS — D649 Anemia, unspecified: Secondary | ICD-10-CM | POA: Diagnosis not present

## 2015-11-01 DIAGNOSIS — E559 Vitamin D deficiency, unspecified: Secondary | ICD-10-CM | POA: Diagnosis not present

## 2015-11-01 DIAGNOSIS — E785 Hyperlipidemia, unspecified: Secondary | ICD-10-CM | POA: Diagnosis not present

## 2015-11-01 DIAGNOSIS — Z79899 Other long term (current) drug therapy: Secondary | ICD-10-CM | POA: Diagnosis not present

## 2015-11-01 DIAGNOSIS — E039 Hypothyroidism, unspecified: Secondary | ICD-10-CM | POA: Diagnosis not present

## 2015-11-01 DIAGNOSIS — E876 Hypokalemia: Secondary | ICD-10-CM | POA: Diagnosis not present

## 2015-11-07 DIAGNOSIS — E038 Other specified hypothyroidism: Secondary | ICD-10-CM | POA: Diagnosis not present

## 2015-11-07 DIAGNOSIS — N182 Chronic kidney disease, stage 2 (mild): Secondary | ICD-10-CM | POA: Diagnosis not present

## 2015-11-07 DIAGNOSIS — I8290 Acute embolism and thrombosis of unspecified vein: Secondary | ICD-10-CM | POA: Diagnosis not present

## 2015-11-07 DIAGNOSIS — M179 Osteoarthritis of knee, unspecified: Secondary | ICD-10-CM | POA: Diagnosis not present

## 2015-11-07 DIAGNOSIS — L989 Disorder of the skin and subcutaneous tissue, unspecified: Secondary | ICD-10-CM | POA: Diagnosis not present

## 2015-11-07 DIAGNOSIS — I10 Essential (primary) hypertension: Secondary | ICD-10-CM | POA: Diagnosis not present

## 2015-11-10 ENCOUNTER — Ambulatory Visit (HOSPITAL_COMMUNITY)
Admission: RE | Admit: 2015-11-10 | Discharge: 2015-11-10 | Disposition: A | Discharge status: Home / Self Care | Payer: Medicare Other | Source: Ambulatory Visit | Attending: Vascular Surgery | Admitting: Vascular Surgery

## 2015-11-10 ENCOUNTER — Other Ambulatory Visit (HOSPITAL_COMMUNITY): Payer: Self-pay | Admitting: Internal Medicine

## 2015-11-10 DIAGNOSIS — R609 Edema, unspecified: Secondary | ICD-10-CM | POA: Diagnosis not present

## 2015-11-10 DIAGNOSIS — I8391 Asymptomatic varicose veins of right lower extremity: Secondary | ICD-10-CM | POA: Insufficient documentation

## 2015-11-10 DIAGNOSIS — I10 Essential (primary) hypertension: Secondary | ICD-10-CM | POA: Insufficient documentation

## 2015-11-10 DIAGNOSIS — I82541 Chronic embolism and thrombosis of right tibial vein: Secondary | ICD-10-CM | POA: Diagnosis not present

## 2015-11-15 DIAGNOSIS — C44629 Squamous cell carcinoma of skin of left upper limb, including shoulder: Secondary | ICD-10-CM | POA: Diagnosis not present

## 2015-11-15 DIAGNOSIS — D485 Neoplasm of uncertain behavior of skin: Secondary | ICD-10-CM | POA: Diagnosis not present

## 2015-11-15 DIAGNOSIS — Z23 Encounter for immunization: Secondary | ICD-10-CM | POA: Diagnosis not present

## 2015-11-15 DIAGNOSIS — L400 Psoriasis vulgaris: Secondary | ICD-10-CM | POA: Diagnosis not present

## 2015-12-07 DIAGNOSIS — D0462 Carcinoma in situ of skin of left upper limb, including shoulder: Secondary | ICD-10-CM | POA: Diagnosis not present

## 2015-12-07 DIAGNOSIS — C44629 Squamous cell carcinoma of skin of left upper limb, including shoulder: Secondary | ICD-10-CM | POA: Diagnosis not present

## 2015-12-11 DIAGNOSIS — E559 Vitamin D deficiency, unspecified: Secondary | ICD-10-CM | POA: Diagnosis not present

## 2015-12-11 DIAGNOSIS — R072 Precordial pain: Secondary | ICD-10-CM | POA: Diagnosis not present

## 2015-12-11 DIAGNOSIS — M25562 Pain in left knee: Secondary | ICD-10-CM | POA: Diagnosis not present

## 2015-12-11 DIAGNOSIS — E034 Atrophy of thyroid (acquired): Secondary | ICD-10-CM | POA: Diagnosis not present

## 2015-12-11 DIAGNOSIS — I5022 Chronic systolic (congestive) heart failure: Secondary | ICD-10-CM | POA: Diagnosis not present

## 2015-12-11 DIAGNOSIS — M25561 Pain in right knee: Secondary | ICD-10-CM | POA: Diagnosis not present

## 2016-01-15 DIAGNOSIS — E559 Vitamin D deficiency, unspecified: Secondary | ICD-10-CM | POA: Diagnosis not present

## 2016-01-15 DIAGNOSIS — R072 Precordial pain: Secondary | ICD-10-CM | POA: Diagnosis not present

## 2016-01-15 DIAGNOSIS — E034 Atrophy of thyroid (acquired): Secondary | ICD-10-CM | POA: Diagnosis not present

## 2016-01-15 DIAGNOSIS — I5022 Chronic systolic (congestive) heart failure: Secondary | ICD-10-CM | POA: Diagnosis not present

## 2016-01-15 DIAGNOSIS — M25561 Pain in right knee: Secondary | ICD-10-CM | POA: Diagnosis not present

## 2016-01-15 DIAGNOSIS — M25562 Pain in left knee: Secondary | ICD-10-CM | POA: Diagnosis not present

## 2016-01-24 DIAGNOSIS — N182 Chronic kidney disease, stage 2 (mild): Secondary | ICD-10-CM | POA: Diagnosis not present

## 2016-01-24 DIAGNOSIS — Z6822 Body mass index (BMI) 22.0-22.9, adult: Secondary | ICD-10-CM | POA: Diagnosis not present

## 2016-01-24 DIAGNOSIS — I8290 Acute embolism and thrombosis of unspecified vein: Secondary | ICD-10-CM | POA: Diagnosis not present

## 2016-01-24 DIAGNOSIS — I10 Essential (primary) hypertension: Secondary | ICD-10-CM | POA: Diagnosis not present

## 2016-02-27 ENCOUNTER — Encounter (HOSPITAL_COMMUNITY): Payer: Self-pay

## 2016-02-27 ENCOUNTER — Emergency Department (HOSPITAL_COMMUNITY)
Admission: EM | Admit: 2016-02-27 | Discharge: 2016-02-28 | Disposition: A | Discharge status: Home / Self Care | Payer: Medicare Other | Attending: Emergency Medicine | Admitting: Emergency Medicine

## 2016-02-27 DIAGNOSIS — I2510 Atherosclerotic heart disease of native coronary artery without angina pectoris: Secondary | ICD-10-CM | POA: Insufficient documentation

## 2016-02-27 DIAGNOSIS — I10 Essential (primary) hypertension: Secondary | ICD-10-CM | POA: Insufficient documentation

## 2016-02-27 DIAGNOSIS — M25561 Pain in right knee: Secondary | ICD-10-CM | POA: Insufficient documentation

## 2016-02-27 DIAGNOSIS — I509 Heart failure, unspecified: Secondary | ICD-10-CM | POA: Insufficient documentation

## 2016-02-27 DIAGNOSIS — M1990 Unspecified osteoarthritis, unspecified site: Secondary | ICD-10-CM | POA: Diagnosis not present

## 2016-02-27 DIAGNOSIS — R600 Localized edema: Secondary | ICD-10-CM | POA: Insufficient documentation

## 2016-02-27 DIAGNOSIS — M109 Gout, unspecified: Secondary | ICD-10-CM | POA: Insufficient documentation

## 2016-02-27 DIAGNOSIS — N289 Disorder of kidney and ureter, unspecified: Secondary | ICD-10-CM | POA: Insufficient documentation

## 2016-02-27 DIAGNOSIS — D500 Iron deficiency anemia secondary to blood loss (chronic): Secondary | ICD-10-CM | POA: Diagnosis not present

## 2016-02-27 DIAGNOSIS — Z7982 Long term (current) use of aspirin: Secondary | ICD-10-CM | POA: Insufficient documentation

## 2016-02-27 DIAGNOSIS — D72819 Decreased white blood cell count, unspecified: Secondary | ICD-10-CM | POA: Insufficient documentation

## 2016-02-27 DIAGNOSIS — Z79899 Other long term (current) drug therapy: Secondary | ICD-10-CM | POA: Insufficient documentation

## 2016-02-27 DIAGNOSIS — E039 Hypothyroidism, unspecified: Secondary | ICD-10-CM | POA: Diagnosis not present

## 2016-02-27 DIAGNOSIS — D649 Anemia, unspecified: Secondary | ICD-10-CM | POA: Diagnosis not present

## 2016-02-27 DIAGNOSIS — R030 Elevated blood-pressure reading, without diagnosis of hypertension: Secondary | ICD-10-CM | POA: Diagnosis not present

## 2016-02-27 DIAGNOSIS — M810 Age-related osteoporosis without current pathological fracture: Secondary | ICD-10-CM | POA: Insufficient documentation

## 2016-02-27 MED ORDER — TRAMADOL HCL 50 MG PO TABS
50.0000 mg | ORAL_TABLET | Freq: Once | ORAL | Status: AC
  Administered 2016-02-27: 50 mg via ORAL
  Filled 2016-02-27: qty 1

## 2016-02-27 NOTE — ED Notes (Signed)
PER EMS: pt from home. Tonight she began to feel flushed and she took her BP and it was high. She called EMS, BP was 240/86, HR-56. Pt denied complaints, was ambulatory to stretcher with minimal assistance, denies pain, no neuro deficits with EMS. A&OX4. No other complaints and last BP was 170/72.

## 2016-02-27 NOTE — ED Notes (Signed)
Dr. Glick at bedside.  

## 2016-02-27 NOTE — ED Provider Notes (Signed)
CSN: [id number]     Arrival date & time 02/27/16  2318 History  By signing my name below, I, [name], attest that this documentation has been prepared under the direction and in the presence of Ashlee Fuel, MD. Electronically Signed: Altamease [name], ED Scribe. 02/27/2016. 11:44 PM   Chief Complaint  Patient presents with  . Hypertension    The history is provided by the patient. No language interpreter was used.   Brought in by EMS from home, [name] is a 82 y.o. female with PMHx of HTN, CAD, CHF, hypothyroidism, and arthritis who presents to the Emergency Department complaining of hypertension with onset tonight. Pt states that she stood from sitting tonight and felt unwell, "not necessarily dizzy", so she measured her blood pressure and it was "260". She called EMS after that reading and for them pt had blood pressure of 240/86. She states that she takes her losartan-hydrochlorothiazide infrequently because it makes her feet swell but she has taken it for the last 2 days. Pt also complains of bilateral knee pain (R>L) that is worse with sitting.   Past Medical History  Diagnosis Date  . Hypertension   . Gout   . Thyroid disease   . Coronary artery disease   . Arthritis   . CHF (congestive heart failure) ([place])   . Hypothyroidism   . Shortness of breath   . Osteoporosis    Past Surgical History  Procedure Laterality Date  . Abdominal hysterectomy    . Appendectomy    . Tonsillectomy    . Carpal tunnel release Right    No family history on file. Social History  Substance Use Topics  . Smoking status: Never Smoker   . Smokeless tobacco: Never Used  . Alcohol Use: No   OB History    No data available     Review of Systems  Musculoskeletal: Positive for arthralgias.  Neurological: Negative for dizziness.  All other systems reviewed and are negative.  Allergies  Crestor  Home Medications   Prior to Admission medications   Medication Sig Start Date  End Date Taking? Authorizing Provider  acetaminophen (TYLENOL) 325 MG tablet Take 650 mg by mouth every 6 (six) hours as needed for mild pain.    Historical Provider, MD  aspirin 81 MG chewable tablet Chew 81 mg by mouth daily.    Historical Provider, MD  Cholecalciferol (VITAMIN D PO) Take 1 tablet by mouth daily as needed (Dietary supplement).     Historical Provider, MD  denosumab (PROLIA) 60 MG/ML SOLN injection Inject 60 mg into the skin every 6 (six) months. Administer in upper arm, thigh, or abdomen    Historical Provider, MD  furosemide (LASIX) 40 MG tablet Take 1 tablet by mouth daily as needed for fluid or edema.  01/17/15   Historical Provider, MD  isosorbide mononitrate (IMDUR) 30 MG 24 hr tablet Take 30 mg by mouth daily. 12/23/13   Historical Provider, MD  levothyroxine (SYNTHROID, LEVOTHROID) 75 MCG tablet Take 75 mcg by mouth daily before breakfast.    Historical Provider, MD  losartan (COZAAR) 50 MG tablet Take 50 mg by mouth daily.    Historical Provider, MD  Menthol-Zinc Oxide (GOLD BOND EX) Apply 1 application topically daily as needed (itching).    Historical Provider, MD  Polyethyl Glycol-Propyl Glycol (SYSTANE OP) Apply 1-2 drops to eye daily as needed (for eye dryness).    Historical Provider, MD  potassium chloride (K-DUR,KLOR-CON) 10 MEQ tablet Take 10 mEq by  mouth daily.     Historical Provider, MD   BP 166/60 mmHg  Pulse 60  Temp(Src) 97.7 F (36.5 C) (Oral)  Resp 16  SpO2 100% Physical Exam  Constitutional: She is oriented to person, place, and time. She appears well-developed and well-nourished. No distress.  HENT:  Head: Normocephalic and atraumatic.  Eyes: Pupils are equal, round, and reactive to light.  Unable to visualize fundi   Neck: Normal range of motion. Neck supple. No JVD present.  Cardiovascular: Normal rate, regular rhythm and normal heart sounds.   No murmur heard. Pulmonary/Chest: Effort normal and breath sounds normal. She has no wheezes. She  has no rales. She exhibits no tenderness.  Abdominal: Soft. Bowel sounds are normal. She exhibits no distension and no mass. There is no tenderness.  Musculoskeletal: Normal range of motion. She exhibits no edema.  1+ pedal edema  Pain with ROM of the right knee   Lymphadenopathy:    She has no cervical adenopathy.  Neurological: She is alert and oriented to person, place, and time. No cranial nerve deficit. She exhibits normal muscle tone. Coordination normal.  Skin: Skin is warm and dry. No rash noted.  Psychiatric: She has a normal mood and affect. Her behavior is normal. Judgment and thought content normal.  Nursing note and vitals reviewed.   ED Course  Procedures (including critical care time) DIAGNOSTIC STUDIES: Oxygen Saturation is 100% on RA, normal by my interpretation.    COORDINATION OF CARE: 11:38 PM Discussed treatment plan which includes lab work, EKG, and tramadol with pt at bedside and pt agreed to plan.  Labs Review Results for orders placed or performed during the hospital encounter of [id number]  Basic metabolic panel  Result Value Ref Range   Sodium 137 135 - 145 mmol/L   Potassium 4.6 3.5 - 5.1 mmol/L   Chloride 102 101 - 111 mmol/L   CO2 29 22 - 32 mmol/L   Glucose, Bld 92 65 - 99 mg/dL   BUN 19 6 - 20 mg/dL   Creatinine, Ser 1.30 (H) 0.44 - 1.00 mg/dL   Calcium 9.0 8.9 - 10.3 mg/dL   GFR calc non Af Amer 33 (L) >60 mL/min   GFR calc Af Amer 38 (L) >60 mL/min   Anion gap 6 5 - 15  CBC with Differential  Result Value Ref Range   WBC 2.9 (L) 4.0 - 10.5 K/uL   RBC 3.85 (L) 3.87 - 5.11 MIL/uL   Hemoglobin 11.4 (L) 12.0 - 15.0 g/dL   HCT 35.6 (L) 36.0 - 46.0 %   MCV 92.5 78.0 - 100.0 fL   MCH 29.6 26.0 - 34.0 pg   MCHC 32.0 30.0 - 36.0 g/dL   RDW 13.9 11.5 - 15.5 %   Platelets 184 150 - 400 K/uL   Neutrophils Relative % 28 %   Lymphocytes Relative 55 %   Monocytes Relative 14 %   Eosinophils Relative 2 %   Basophils Relative 1 %   Neutro Abs 0.8 (L)  1.7 - 7.7 K/uL   Lymphs Abs 1.6 0.7 - 4.0 K/uL   Monocytes Absolute 0.4 0.1 - 1.0 K/uL   Eosinophils Absolute 0.1 0.0 - 0.7 K/uL   Basophils Absolute 0.0 0.0 - 0.1 K/uL   WBC Morphology ATYPICAL LYMPHOCYTES    Smear Review LARGE PLATELETS PRESENT    I have personally reviewed and evaluated these lab results as part of my medical decision-making.   EKG Interpretation   Date/Time:  Tuesday Feb 27 2016 23:30:44 EDT Ventricular Rate:  61 PR Interval:  181 QRS Duration: 94 QT Interval:  411 QTC Calculation: 414 R Axis:   58 Text Interpretation:  Sinus rhythm Borderline low voltage, extremity leads  Baseline wander in lead(s) I III aVL When compared with ECG of  11/201/0016, No significant change was found Confirmed by [facility]  MD, Nayellie Sanseverino  ([id number]) on 02/27/2016 11:43:12 PM      MDM   Final diagnoses:  Labile essential hypertension  Renal insufficiency  Normochromic normocytic anemia  Leukopenia    Elevated blood pressure without evidence of actual endorgan damage. There is a slight bump in creatinine which will need to be followed as an outpatient. She has chronic leukopenia which is unchanged. Blood pressure has been noted to be quite labile in the ED. I do not feel that it would be appropriate to change her medication as I would be worried about times of low blood pressure. She is referred back to her PCP for blood pressure management. I have spent considerable time discussing this with patient and with family.  I personally performed the services described in this documentation, which was scribed in my presence. The recorded information has been reviewed and is accurate.      Ashlee Fuel, MD [id number]

## 2016-02-28 LAB — CBC WITH DIFFERENTIAL/PLATELET
BASOS PCT: 1 %
Basophils Absolute: 0 10*3/uL (ref 0.0–0.1)
EOS ABS: 0.1 10*3/uL (ref 0.0–0.7)
Eosinophils Relative: 2 %
HEMATOCRIT: 35.6 % — AB (ref 36.0–46.0)
Hemoglobin: 11.4 g/dL — ABNORMAL LOW (ref 12.0–15.0)
LYMPHS ABS: 1.6 10*3/uL (ref 0.7–4.0)
Lymphocytes Relative: 55 %
MCH: 29.6 pg (ref 26.0–34.0)
MCHC: 32 g/dL (ref 30.0–36.0)
MCV: 92.5 fL (ref 78.0–100.0)
MONO ABS: 0.4 10*3/uL (ref 0.1–1.0)
Monocytes Relative: 14 %
Neutro Abs: 0.8 10*3/uL — ABNORMAL LOW (ref 1.7–7.7)
Neutrophils Relative %: 28 %
PLATELETS: 184 10*3/uL (ref 150–400)
RBC: 3.85 MIL/uL — ABNORMAL LOW (ref 3.87–5.11)
RDW: 13.9 % (ref 11.5–15.5)
WBC: 2.9 10*3/uL — ABNORMAL LOW (ref 4.0–10.5)

## 2016-02-28 LAB — PATHOLOGIST SMEAR REVIEW

## 2016-02-28 LAB — BASIC METABOLIC PANEL
Anion gap: 6 (ref 5–15)
BUN: 19 mg/dL (ref 6–20)
CALCIUM: 9 mg/dL (ref 8.9–10.3)
CO2: 29 mmol/L (ref 22–32)
CREATININE: 1.3 mg/dL — AB (ref 0.44–1.00)
Chloride: 102 mmol/L (ref 101–111)
GFR, EST AFRICAN AMERICAN: 38 mL/min — AB (ref >60)
GFR, EST NON AFRICAN AMERICAN: 33 mL/min — AB (ref >60)
GLUCOSE: 92 mg/dL (ref 65–99)
Potassium: 4.6 mmol/L (ref 3.5–5.1)
Sodium: 137 mmol/L (ref 135–145)

## 2016-02-28 NOTE — Discharge Instructions (Signed)
Continue to monitor your blood pressure at home. If it comes back very high, recheck it in 20-30 minutes.  Today, your of creatinine, a blood test of your kidney, is higher than it had been. Please have this rechecked when you see your doctor next week.  Hypertension Hypertension, commonly called high blood pressure, is when the force of blood pumping through your arteries is too strong. Your arteries are the blood vessels that carry blood from your heart throughout your body. A blood pressure reading consists of a higher number over a lower number, such as 110/72. The higher number (systolic) is the pressure inside your arteries when your heart pumps. The lower number (diastolic) is the pressure inside your arteries when your heart relaxes. Ideally you want your blood pressure below 120/80. Hypertension forces your heart to work harder to pump blood. Your arteries may become narrow or stiff. Having untreated or uncontrolled hypertension can cause heart attack, stroke, kidney disease, and other problems. RISK FACTORS Some risk factors for high blood pressure are controllable. Others are not.  Risk factors you cannot control include:   Race. You may be at higher risk if you are African American.  Age. Risk increases with age.  Gender. Men are at higher risk than women before age 37 years. After age 37, women are at higher risk than men. Risk factors you can control include:  Not getting enough exercise or physical activity.  Being overweight.  Getting too much fat, sugar, calories, or salt in your diet.  Drinking too much alcohol. SIGNS AND SYMPTOMS Hypertension does not usually cause signs or symptoms. Extremely high blood pressure (hypertensive crisis) may cause headache, anxiety, shortness of breath, and nosebleed. DIAGNOSIS To check if you have hypertension, your health care provider will measure your blood pressure while you are seated, with your arm held at the level of your heart.  It should be measured at least twice using the same arm. Certain conditions can cause a difference in blood pressure between your right and left arms. A blood pressure reading that is higher than normal on one occasion does not mean that you need treatment. If it is not clear whether you have high blood pressure, you may be asked to return on a different day to have your blood pressure checked again. Or, you may be asked to monitor your blood pressure at home for 1 or more weeks. TREATMENT Treating high blood pressure includes making lifestyle changes and possibly taking medicine. Living a healthy lifestyle can help lower high blood pressure. You may need to change some of your habits. Lifestyle changes may include:  Following the DASH diet. This diet is high in fruits, vegetables, and whole grains. It is low in salt, red meat, and added sugars.  Keep your sodium intake below 2,300 mg per day.  Getting at least 30-45 minutes of aerobic exercise at least 4 times per week.  Losing weight if necessary.  Not smoking.  Limiting alcoholic beverages.  Learning ways to reduce stress. Your health care provider may prescribe medicine if lifestyle changes are not enough to get your blood pressure under control, and if one of the following is true:  You are 63-18 years of age and your systolic blood pressure is above 140.  You are 34 years of age or older, and your systolic blood pressure is above 150.  Your diastolic blood pressure is above 90.  You have diabetes, and your systolic blood pressure is over [id number] or your diastolic blood  pressure is over 90.  You have kidney disease and your blood pressure is above 140/90.  You have heart disease and your blood pressure is above 140/90. Your personal target blood pressure may vary depending on your medical conditions, your age, and other factors. HOME CARE INSTRUCTIONS  Have your blood pressure rechecked as directed by your health care provider.    Take medicines only as directed by your health care provider. Follow the directions carefully. Blood pressure medicines must be taken as prescribed. The medicine does not work as well when you skip doses. Skipping doses also puts you at risk for problems.  Do not smoke.   Monitor your blood pressure at home as directed by your health care provider. SEEK MEDICAL CARE IF:   You think you are having a reaction to medicines taken.  You have recurrent headaches or feel dizzy.  You have swelling in your ankles.  You have trouble with your vision. SEEK IMMEDIATE MEDICAL CARE IF:  You develop a severe headache or confusion.  You have unusual weakness, numbness, or feel faint.  You have severe chest or abdominal pain.  You vomit repeatedly.  You have trouble breathing. MAKE SURE YOU:   Understand these instructions.  Will watch your condition.  Will get help right away if you are not doing well or get worse.   This information is not intended to replace advice given to you by your health care provider. Make sure you discuss any questions you have with your health care provider.   Document Released: 09/16/2005 Document Revised: 01/31/2015 Document Reviewed: 07/09/2013 Elsevier Interactive Patient Education [employer].

## 2016-03-11 DIAGNOSIS — Z6822 Body mass index (BMI) 22.0-22.9, adult: Secondary | ICD-10-CM | POA: Diagnosis not present

## 2016-03-11 DIAGNOSIS — I10 Essential (primary) hypertension: Secondary | ICD-10-CM | POA: Diagnosis not present

## 2016-03-18 DIAGNOSIS — R072 Precordial pain: Secondary | ICD-10-CM | POA: Diagnosis not present

## 2016-03-18 DIAGNOSIS — E034 Atrophy of thyroid (acquired): Secondary | ICD-10-CM | POA: Diagnosis not present

## 2016-03-18 DIAGNOSIS — I5022 Chronic systolic (congestive) heart failure: Secondary | ICD-10-CM | POA: Diagnosis not present

## 2016-03-18 DIAGNOSIS — M25561 Pain in right knee: Secondary | ICD-10-CM | POA: Diagnosis not present

## 2016-03-18 DIAGNOSIS — M25562 Pain in left knee: Secondary | ICD-10-CM | POA: Diagnosis not present

## 2016-03-18 DIAGNOSIS — E559 Vitamin D deficiency, unspecified: Secondary | ICD-10-CM | POA: Diagnosis not present

## 2016-04-15 DIAGNOSIS — M25562 Pain in left knee: Secondary | ICD-10-CM | POA: Diagnosis not present

## 2016-04-15 DIAGNOSIS — E034 Atrophy of thyroid (acquired): Secondary | ICD-10-CM | POA: Diagnosis not present

## 2016-04-15 DIAGNOSIS — E559 Vitamin D deficiency, unspecified: Secondary | ICD-10-CM | POA: Diagnosis not present

## 2016-04-15 DIAGNOSIS — R072 Precordial pain: Secondary | ICD-10-CM | POA: Diagnosis not present

## 2016-04-15 DIAGNOSIS — M25561 Pain in right knee: Secondary | ICD-10-CM | POA: Diagnosis not present

## 2016-04-30 DIAGNOSIS — I10 Essential (primary) hypertension: Secondary | ICD-10-CM | POA: Diagnosis not present

## 2016-04-30 DIAGNOSIS — M810 Age-related osteoporosis without current pathological fracture: Secondary | ICD-10-CM | POA: Diagnosis not present

## 2016-04-30 DIAGNOSIS — E038 Other specified hypothyroidism: Secondary | ICD-10-CM | POA: Diagnosis not present

## 2016-04-30 DIAGNOSIS — M109 Gout, unspecified: Secondary | ICD-10-CM | POA: Diagnosis not present

## 2016-05-06 DIAGNOSIS — M179 Osteoarthritis of knee, unspecified: Secondary | ICD-10-CM | POA: Diagnosis not present

## 2016-05-06 DIAGNOSIS — Z23 Encounter for immunization: Secondary | ICD-10-CM | POA: Diagnosis not present

## 2016-05-06 DIAGNOSIS — E038 Other specified hypothyroidism: Secondary | ICD-10-CM | POA: Diagnosis not present

## 2016-05-06 DIAGNOSIS — I10 Essential (primary) hypertension: Secondary | ICD-10-CM | POA: Diagnosis not present

## 2016-05-06 DIAGNOSIS — M810 Age-related osteoporosis without current pathological fracture: Secondary | ICD-10-CM | POA: Diagnosis not present

## 2016-05-06 DIAGNOSIS — Z1389 Encounter for screening for other disorder: Secondary | ICD-10-CM | POA: Diagnosis not present

## 2016-05-06 DIAGNOSIS — Z0000 Encounter for general adult medical examination without abnormal findings: Secondary | ICD-10-CM | POA: Diagnosis not present

## 2016-05-06 DIAGNOSIS — Z6821 Body mass index (BMI) 21.0-21.9, adult: Secondary | ICD-10-CM | POA: Diagnosis not present

## 2016-05-06 DIAGNOSIS — I8290 Acute embolism and thrombosis of unspecified vein: Secondary | ICD-10-CM | POA: Diagnosis not present

## 2016-05-06 DIAGNOSIS — L989 Disorder of the skin and subcutaneous tissue, unspecified: Secondary | ICD-10-CM | POA: Diagnosis not present

## 2016-05-06 DIAGNOSIS — M109 Gout, unspecified: Secondary | ICD-10-CM | POA: Diagnosis not present

## 2016-05-16 DIAGNOSIS — M25561 Pain in right knee: Secondary | ICD-10-CM | POA: Diagnosis not present

## 2016-05-16 DIAGNOSIS — E559 Vitamin D deficiency, unspecified: Secondary | ICD-10-CM | POA: Diagnosis not present

## 2016-05-16 DIAGNOSIS — R072 Precordial pain: Secondary | ICD-10-CM | POA: Diagnosis not present

## 2016-05-16 DIAGNOSIS — M25562 Pain in left knee: Secondary | ICD-10-CM | POA: Diagnosis not present

## 2016-05-16 DIAGNOSIS — E034 Atrophy of thyroid (acquired): Secondary | ICD-10-CM | POA: Diagnosis not present

## 2016-05-21 DIAGNOSIS — I10 Essential (primary) hypertension: Secondary | ICD-10-CM | POA: Diagnosis not present

## 2016-05-21 DIAGNOSIS — Z6822 Body mass index (BMI) 22.0-22.9, adult: Secondary | ICD-10-CM | POA: Diagnosis not present

## 2016-05-24 ENCOUNTER — Ambulatory Visit (HOSPITAL_COMMUNITY): Admission: RE | Admit: 2016-05-24 | Payer: Medicare Other | Source: Ambulatory Visit

## 2016-05-24 ENCOUNTER — Other Ambulatory Visit (HOSPITAL_COMMUNITY): Payer: Self-pay | Admitting: Internal Medicine

## 2016-05-27 DIAGNOSIS — I10 Essential (primary) hypertension: Secondary | ICD-10-CM | POA: Diagnosis not present

## 2016-06-20 DIAGNOSIS — I350 Nonrheumatic aortic (valve) stenosis: Secondary | ICD-10-CM | POA: Diagnosis not present

## 2016-06-20 DIAGNOSIS — R072 Precordial pain: Secondary | ICD-10-CM | POA: Diagnosis not present

## 2016-06-20 DIAGNOSIS — I425 Other restrictive cardiomyopathy: Secondary | ICD-10-CM | POA: Diagnosis not present

## 2016-06-21 ENCOUNTER — Inpatient Hospital Stay (HOSPITAL_COMMUNITY): Payer: Medicare Other

## 2016-06-21 ENCOUNTER — Encounter (HOSPITAL_COMMUNITY): Payer: Self-pay

## 2016-06-21 ENCOUNTER — Inpatient Hospital Stay (HOSPITAL_COMMUNITY)
Admission: AD | Admit: 2016-06-21 | Discharge: 2016-06-23 | DRG: 392 | Disposition: A | Discharge status: Home / Self Care | Payer: Medicare Other | Source: Ambulatory Visit | Attending: Cardiovascular Disease | Admitting: Cardiovascular Disease

## 2016-06-21 DIAGNOSIS — Z79899 Other long term (current) drug therapy: Secondary | ICD-10-CM

## 2016-06-21 DIAGNOSIS — Z66 Do not resuscitate: Secondary | ICD-10-CM | POA: Diagnosis present

## 2016-06-21 DIAGNOSIS — I509 Heart failure, unspecified: Secondary | ICD-10-CM | POA: Diagnosis present

## 2016-06-21 DIAGNOSIS — K922 Gastrointestinal hemorrhage, unspecified: Secondary | ICD-10-CM | POA: Diagnosis present

## 2016-06-21 DIAGNOSIS — I2510 Atherosclerotic heart disease of native coronary artery without angina pectoris: Secondary | ICD-10-CM | POA: Diagnosis present

## 2016-06-21 DIAGNOSIS — R195 Other fecal abnormalities: Secondary | ICD-10-CM | POA: Diagnosis not present

## 2016-06-21 DIAGNOSIS — E079 Disorder of thyroid, unspecified: Secondary | ICD-10-CM | POA: Diagnosis present

## 2016-06-21 DIAGNOSIS — Z7982 Long term (current) use of aspirin: Secondary | ICD-10-CM | POA: Diagnosis not present

## 2016-06-21 DIAGNOSIS — M109 Gout, unspecified: Secondary | ICD-10-CM | POA: Diagnosis present

## 2016-06-21 DIAGNOSIS — E034 Atrophy of thyroid (acquired): Secondary | ICD-10-CM | POA: Diagnosis not present

## 2016-06-21 DIAGNOSIS — E039 Hypothyroidism, unspecified: Secondary | ICD-10-CM | POA: Diagnosis present

## 2016-06-21 DIAGNOSIS — K449 Diaphragmatic hernia without obstruction or gangrene: Secondary | ICD-10-CM | POA: Diagnosis present

## 2016-06-21 DIAGNOSIS — I110 Hypertensive heart disease with heart failure: Secondary | ICD-10-CM | POA: Diagnosis present

## 2016-06-21 DIAGNOSIS — R600 Localized edema: Secondary | ICD-10-CM | POA: Diagnosis present

## 2016-06-21 DIAGNOSIS — M1990 Unspecified osteoarthritis, unspecified site: Secondary | ICD-10-CM | POA: Diagnosis present

## 2016-06-21 DIAGNOSIS — Z23 Encounter for immunization: Secondary | ICD-10-CM | POA: Diagnosis not present

## 2016-06-21 DIAGNOSIS — R002 Palpitations: Secondary | ICD-10-CM | POA: Diagnosis not present

## 2016-06-21 DIAGNOSIS — R0602 Shortness of breath: Secondary | ICD-10-CM | POA: Diagnosis present

## 2016-06-21 DIAGNOSIS — M810 Age-related osteoporosis without current pathological fracture: Secondary | ICD-10-CM | POA: Diagnosis present

## 2016-06-21 DIAGNOSIS — D649 Anemia, unspecified: Secondary | ICD-10-CM | POA: Diagnosis present

## 2016-06-21 DIAGNOSIS — I5022 Chronic systolic (congestive) heart failure: Secondary | ICD-10-CM | POA: Diagnosis not present

## 2016-06-21 DIAGNOSIS — I10 Essential (primary) hypertension: Secondary | ICD-10-CM | POA: Diagnosis not present

## 2016-06-21 LAB — COMPREHENSIVE METABOLIC PANEL
ALT: 8 U/L — ABNORMAL LOW (ref 14–54)
AST: 16 U/L (ref 15–41)
Albumin: 3.5 g/dL (ref 3.5–5.0)
Alkaline Phosphatase: 51 U/L (ref 38–126)
Anion gap: 7 (ref 5–15)
BUN: 25 mg/dL — AB (ref 6–20)
CHLORIDE: 102 mmol/L (ref 101–111)
CO2: 30 mmol/L (ref 22–32)
Calcium: 9.1 mg/dL (ref 8.9–10.3)
Creatinine, Ser: 1.04 mg/dL — ABNORMAL HIGH (ref 0.44–1.00)
GFR, EST AFRICAN AMERICAN: 50 mL/min — AB (ref >60)
GFR, EST NON AFRICAN AMERICAN: 43 mL/min — AB (ref >60)
Glucose, Bld: 85 mg/dL (ref 65–99)
POTASSIUM: 3.8 mmol/L (ref 3.5–5.1)
Sodium: 139 mmol/L (ref 135–145)
Total Bilirubin: 0.9 mg/dL (ref 0.3–1.2)
Total Protein: 6.4 g/dL — ABNORMAL LOW (ref 6.5–8.1)

## 2016-06-21 LAB — CBC WITH DIFFERENTIAL/PLATELET
Basophils Absolute: 0 10*3/uL (ref 0.0–0.1)
Basophils Relative: 1 %
Eosinophils Absolute: 0.1 10*3/uL (ref 0.0–0.7)
Eosinophils Relative: 2 %
HEMATOCRIT: 34.3 % — AB (ref 36.0–46.0)
HEMOGLOBIN: 11.1 g/dL — AB (ref 12.0–15.0)
LYMPHS ABS: 1.4 10*3/uL (ref 0.7–4.0)
LYMPHS PCT: 49 %
MCH: 30.6 pg (ref 26.0–34.0)
MCHC: 32.4 g/dL (ref 30.0–36.0)
MCV: 94.5 fL (ref 78.0–100.0)
Monocytes Absolute: 0.2 10*3/uL (ref 0.1–1.0)
Monocytes Relative: 8 %
NEUTROS PCT: 40 %
Neutro Abs: 1.1 10*3/uL — ABNORMAL LOW (ref 1.7–7.7)
Platelets: 194 10*3/uL (ref 150–400)
RBC: 3.63 MIL/uL — AB (ref 3.87–5.11)
RDW: 13.4 % (ref 11.5–15.5)
WBC: 2.8 10*3/uL — AB (ref 4.0–10.5)

## 2016-06-21 LAB — OCCULT BLOOD X 1 CARD TO LAB, STOOL: Fecal Occult Bld: NEGATIVE

## 2016-06-21 MED ORDER — SODIUM CHLORIDE 0.9% FLUSH
3.0000 mL | Freq: Two times a day (BID) | INTRAVENOUS | Status: DC
Start: 2016-06-21 — End: 2016-06-23
  Administered 2016-06-21 – 2016-06-22 (×3): 3 mL via INTRAVENOUS

## 2016-06-21 MED ORDER — PANTOPRAZOLE SODIUM 40 MG PO TBEC
40.0000 mg | DELAYED_RELEASE_TABLET | Freq: Every day | ORAL | Status: DC
  Administered 2016-06-21 – 2016-06-22 (×2): 40 mg via ORAL
  Filled 2016-06-21 (×2): qty 1

## 2016-06-21 MED ORDER — FAMOTIDINE IN NACL 20-0.9 MG/50ML-% IV SOLN
20.0000 mg | INTRAVENOUS | Status: DC
  Administered 2016-06-21 – 2016-06-22 (×2): 20 mg via INTRAVENOUS
  Filled 2016-06-21 (×3): qty 50

## 2016-06-21 MED ORDER — SODIUM CHLORIDE 0.9 % IV SOLN: 250.0000 mL | INTRAVENOUS | Status: DC | PRN

## 2016-06-21 MED ORDER — ARTIFICIAL TEARS OP OINT
TOPICAL_OINTMENT | Freq: Every day | OPHTHALMIC | Status: DC | PRN
Start: 2016-06-21 — End: 2016-06-23
  Filled 2016-06-21: qty 3.5

## 2016-06-21 MED ORDER — INFLUENZA VAC SPLIT QUAD 0.5 ML IM SUSY
0.5000 mL | PREFILLED_SYRINGE | INTRAMUSCULAR | Status: AC
  Administered 2016-06-22: 0.5 mL via INTRAMUSCULAR
  Filled 2016-06-21: qty 0.5

## 2016-06-21 MED ORDER — FAMOTIDINE IN NACL 20-0.9 MG/50ML-% IV SOLN
20.0000 mg | Freq: Two times a day (BID) | INTRAVENOUS | Status: DC
  Filled 2016-06-21: qty 50

## 2016-06-21 MED ORDER — ACETAMINOPHEN 325 MG PO TABS: 650.0000 mg | ORAL_TABLET | Freq: Four times a day (QID) | ORAL | Status: DC | PRN

## 2016-06-21 MED ORDER — ONDANSETRON HCL 4 MG PO TABS: 4.0000 mg | ORAL_TABLET | Freq: Four times a day (QID) | ORAL | Status: DC | PRN

## 2016-06-21 MED ORDER — ACETAMINOPHEN 650 MG RE SUPP: 650.0000 mg | Freq: Four times a day (QID) | RECTAL | Status: DC | PRN

## 2016-06-21 MED ORDER — POLYETHYL GLYCOL-PROPYL GLYCOL 0.4-0.3 % OP GEL: Freq: Every day | OPHTHALMIC | Status: DC | PRN

## 2016-06-21 MED ORDER — SODIUM CHLORIDE 0.9% FLUSH
3.0000 mL | INTRAVENOUS | Status: DC | PRN
Start: 2016-06-21 — End: 2016-06-23
  Administered 2016-06-22: 3 mL via INTRAVENOUS
  Filled 2016-06-21: qty 3

## 2016-06-21 MED ORDER — LEVOTHYROXINE SODIUM 75 MCG PO TABS
75.0000 ug | ORAL_TABLET | Freq: Every day | ORAL | Status: DC
  Administered 2016-06-22 – 2016-06-23 (×2): 75 ug via ORAL
  Filled 2016-06-21 (×2): qty 1

## 2016-06-21 MED ORDER — ONDANSETRON HCL 4 MG/2ML IJ SOLN
4.0000 mg | Freq: Four times a day (QID) | INTRAMUSCULAR | Status: DC | PRN
Start: 2016-06-21 — End: 2016-06-23

## 2016-06-21 NOTE — H&P (Signed)
Referring Physician:  [name] is an 82 y.o. female.                       Chief Complaint: Dark stool x 3 since yesterday.  HPI: 82 year old female with past history of hypertension, CAD, CHF, Hypothyroidism, Palpitations, Osteoporosis has noticed stools getting darker over last 1-2 days. She denies chest pain or dizziness. She also denies abdominal pain or nausea or vomiting. She is on small dose of 81 mg. aspirin daily.   Past Medical History:  Diagnosis Date  . Arthritis   . CHF (congestive heart failure) ([place])   . Coronary artery disease   . Gout   . Hypertension   . Hypothyroidism   . Osteoporosis   . Shortness of breath   . Thyroid disease       Past Surgical History:  Procedure Laterality Date  . ABDOMINAL HYSTERECTOMY    . APPENDECTOMY    . CARPAL TUNNEL RELEASE Right   . TONSILLECTOMY      No family history on file. Social History:  reports that she has never smoked. She has never used smokeless tobacco. She reports that she does not drink alcohol or use drugs.  Allergies:  Allergies  Allergen Reactions  . Crestor [Rosuvastatin] Nausea Only    Medications Prior to Admission  Medication Sig Dispense Refill  . acetaminophen (TYLENOL) 325 MG tablet Take 650 mg by mouth every 6 (six) hours as needed for mild pain.    Marland Kitchen aspirin 81 MG chewable tablet Chew 81 mg by mouth daily.    . Cholecalciferol (VITAMIN D PO) Take 1 tablet by mouth daily as needed (Dietary supplement).     Marland Kitchen denosumab (PROLIA) 60 MG/ML SOLN injection Inject 60 mg into the skin every 6 (six) months. Administer in upper arm, thigh, or abdomen    . furosemide (LASIX) 40 MG tablet Take 1 tablet by mouth daily as needed for fluid or edema.     . isosorbide mononitrate (IMDUR) 30 MG 24 hr tablet Take 30 mg by mouth daily.    Marland Kitchen levothyroxine (SYNTHROID, LEVOTHROID) 75 MCG tablet Take 75 mcg by mouth daily before breakfast.    . losartan (COZAAR) 50 MG tablet Take 50 mg by mouth daily.    .  Menthol-Zinc Oxide (GOLD BOND EX) Apply 1 application topically daily as needed (itching).    Vladimir Faster Glycol-Propyl Glycol (SYSTANE OP) Apply 1-2 drops to eye daily as needed (for eye dryness).    . potassium chloride (K-DUR,KLOR-CON) 10 MEQ tablet Take 10 mEq by mouth daily.       No results found for this or any previous visit (from the past 48 hour(s)). No results found.  Review Of Systems Constitutional: negative for fever or chills. HENT: No sinus congestion or chronic rhinorrhea. Eyes: Negative for redness or visual disturbance. Respiratory: Positive for chronic shortness of breath. No wheezing or asthma. Cardiovascular: Positive for palpitations. Occasional chest pain. Gastrointestinal: No nausea or vomiting. Musculoskeletal: Positive arthritis. Skin: No rash. Neurological: No dizziness or headaches.   Blood pressure (!) 152/54, pulse 61, temperature 98 F (36.7 C), temperature source Oral, resp. rate 16, SpO2 99 %. General appearance: alert, cooperative, appears stated age and mild distress Head: Normocephalic, without obvious abnormality, atraumatic Eyes: Conjunctivae pale. Corneas clear. PERRL, EOM's intact.  Neck: no adenopathy, no JVD, supple, symmetrical, trachea midline and thyroid not enlarged. Resp: Clear to auscultation bilaterally Cardio: Regular rate and rhythm, S1, S2  normal, II/VI murmur, click, rub or gallop. GI: soft, non-tender; bowel sounds normal; no masses,  no organomegaly.  Rectal: Soft scanty dark stool. Skin: Pale, warm and dry. Neurologic: Alert and oriented X 3, Frail. Normal coordination and unsteady gait  Assessment/Plan Possible acute GI bleed Hypertension Hypothyroidism CAD H/O CHF  Admit. Stool for occult blood. Blood work.  Hold Aspirin. Possible GI consult. DNR.  Birdie Riddle, MD  06/21/2016, 5:48 PM

## 2016-06-21 NOTE — Progress Notes (Signed)
Admission:  Patient came in as a Direct Admit from home. Patient oriented to room, changed into hospital gown. IV completed. MD assessed patient, New orders given and implemented. Bed Alarm on, call bell in place. Will continue to monitor.

## 2016-06-22 DIAGNOSIS — I110 Hypertensive heart disease with heart failure: Secondary | ICD-10-CM | POA: Diagnosis not present

## 2016-06-22 DIAGNOSIS — R195 Other fecal abnormalities: Secondary | ICD-10-CM | POA: Diagnosis not present

## 2016-06-22 DIAGNOSIS — I10 Essential (primary) hypertension: Secondary | ICD-10-CM | POA: Diagnosis not present

## 2016-06-22 DIAGNOSIS — I509 Heart failure, unspecified: Secondary | ICD-10-CM | POA: Diagnosis not present

## 2016-06-22 DIAGNOSIS — R600 Localized edema: Secondary | ICD-10-CM | POA: Diagnosis not present

## 2016-06-22 DIAGNOSIS — D649 Anemia, unspecified: Secondary | ICD-10-CM | POA: Diagnosis not present

## 2016-06-22 DIAGNOSIS — E034 Atrophy of thyroid (acquired): Secondary | ICD-10-CM | POA: Diagnosis not present

## 2016-06-22 DIAGNOSIS — R002 Palpitations: Secondary | ICD-10-CM | POA: Diagnosis not present

## 2016-06-22 DIAGNOSIS — I5022 Chronic systolic (congestive) heart failure: Secondary | ICD-10-CM | POA: Diagnosis not present

## 2016-06-22 DIAGNOSIS — I2510 Atherosclerotic heart disease of native coronary artery without angina pectoris: Secondary | ICD-10-CM | POA: Diagnosis not present

## 2016-06-22 LAB — RETICULOCYTES
RBC.: 3.47 MIL/uL — ABNORMAL LOW (ref 3.87–5.11)
RETIC CT PCT: 0.4 % (ref 0.4–3.1)
Retic Count, Absolute: 13.9 10*3/uL — ABNORMAL LOW (ref 19.0–186.0)

## 2016-06-22 LAB — IRON AND TIBC
IRON: 66 ug/dL (ref 28–170)
SATURATION RATIOS: 25 % (ref 10.4–31.8)
TIBC: 269 ug/dL (ref 250–450)
UIBC: 203 ug/dL

## 2016-06-22 LAB — BASIC METABOLIC PANEL
Anion gap: 6 (ref 5–15)
BUN: 20 mg/dL (ref 6–20)
CHLORIDE: 104 mmol/L (ref 101–111)
CO2: 30 mmol/L (ref 22–32)
Calcium: 9.1 mg/dL (ref 8.9–10.3)
Creatinine, Ser: 0.91 mg/dL (ref 0.44–1.00)
GFR calc Af Amer: 59 mL/min — ABNORMAL LOW (ref >60)
GFR calc non Af Amer: 51 mL/min — ABNORMAL LOW (ref >60)
GLUCOSE: 73 mg/dL (ref 65–99)
POTASSIUM: 3.5 mmol/L (ref 3.5–5.1)
Sodium: 140 mmol/L (ref 135–145)

## 2016-06-22 LAB — FERRITIN: FERRITIN: 32 ng/mL (ref 11–307)

## 2016-06-22 LAB — CBC
HEMATOCRIT: 32.6 % — AB (ref 36.0–46.0)
Hemoglobin: 10.5 g/dL — ABNORMAL LOW (ref 12.0–15.0)
MCH: 30.5 pg (ref 26.0–34.0)
MCHC: 32.2 g/dL (ref 30.0–36.0)
MCV: 94.8 fL (ref 78.0–100.0)
Platelets: 186 10*3/uL (ref 150–400)
RBC: 3.44 MIL/uL — ABNORMAL LOW (ref 3.87–5.11)
RDW: 13.7 % (ref 11.5–15.5)
WBC: 2.7 10*3/uL — ABNORMAL LOW (ref 4.0–10.5)

## 2016-06-22 LAB — PROTIME-INR
INR: 1.06
Prothrombin Time: 13.8 seconds (ref 11.4–15.2)

## 2016-06-22 LAB — FOLATE: Folate: 15.3 ng/mL (ref >5.9)

## 2016-06-22 LAB — VITAMIN B12: VITAMIN B 12: 234 pg/mL (ref 180–914)

## 2016-06-22 MED ORDER — FUROSEMIDE 10 MG/ML IJ SOLN
40.0000 mg | Freq: Once | INTRAMUSCULAR | Status: AC
  Administered 2016-06-22: 40 mg via INTRAVENOUS
  Filled 2016-06-22: qty 4

## 2016-06-22 NOTE — Progress Notes (Signed)
Ref: Velna Hatchet, MD   Subjective:  No new complaint. Stool for occult blood is negative so far.   Objective:  Vital Signs in the last 24 hours: Temp:  [97.4 F (36.3 C)-98 F (36.7 C)] 97.4 F (36.3 C) (09/23 0740) Pulse Rate:  [53-66] 53 (09/23 0740) Resp:  [15-16] 16 (09/23 0740) BP: (138-152)/(49-54) 138/50 (09/23 0740) SpO2:  [99 %-100 %] 100 % (09/23 0740) Weight:  [53.9 kg (118 lb 14.4 oz)-54.2 kg (119 lb 7.8 oz)] 54.2 kg (119 lb 7.8 oz) (09/22 2109)  Physical Exam: BP Readings from Last 1 Encounters:  06/22/16 (!) 138/50    Wt Readings from Last 1 Encounters:  06/21/16 54.2 kg (119 lb 7.8 oz)    Weight change:   HEENT: [place]/AT, Eyes-Brown, PERL, EOMI, Conjunctiva-Pale, Sclera-Non-icteric Neck: No JVD, No bruit, Trachea midline. Lungs:  Clear, Bilateral. Cardiac:  Regular rhythm, normal S1 and S2, no S3. II/VI systolic murmur. Abdomen:  Soft, non-tender. Extremities:  2 + edema present. No cyanosis. No clubbing. CNS: AxOx3, Cranial nerves grossly intact, moves all 4 extremities.  Skin: Warm and dry.   Intake/Output from previous day: 09/22 0701 - 09/23 0700 In: 290 [P.O.:240; IV Piggyback:50] Out: 101 [Urine:100; Stool:1]    Lab Results: BMET    Component Value Date/Time   NA 140 06/22/2016 0413   NA 139 06/21/2016 1814   NA 137 02/27/2016 2352   K 3.5 06/22/2016 0413   K 3.8 06/21/2016 1814   K 4.6 02/27/2016 2352   CL 104 06/22/2016 0413   CL 102 06/21/2016 1814   CL 102 02/27/2016 2352   CO2 30 06/22/2016 0413   CO2 30 06/21/2016 1814   CO2 29 02/27/2016 2352   GLUCOSE 73 06/22/2016 0413   GLUCOSE 85 06/21/2016 1814   GLUCOSE 92 02/27/2016 2352   BUN 20 06/22/2016 0413   BUN 25 (H) 06/21/2016 1814   BUN 19 02/27/2016 2352   CREATININE 0.91 06/22/2016 0413   CREATININE 1.04 (H) 06/21/2016 1814   CREATININE 1.30 (H) 02/27/2016 2352   CALCIUM 9.1 06/22/2016 0413   CALCIUM 9.1 06/21/2016 1814   CALCIUM 9.0 02/27/2016 2352   GFRNONAA 51 (L)  06/22/2016 0413   GFRNONAA 43 (L) 06/21/2016 1814   GFRNONAA 33 (L) 02/27/2016 2352   GFRAA 59 (L) 06/22/2016 0413   GFRAA 50 (L) 06/21/2016 1814   GFRAA 38 (L) 02/27/2016 2352   CBC    Component Value Date/Time   WBC 2.7 (L) 06/22/2016 0413   RBC 3.47 (L) 06/22/2016 1028   RBC 3.44 (L) 06/22/2016 0413   HGB 10.5 (L) 06/22/2016 0413   HCT 32.6 (L) 06/22/2016 0413   PLT 186 06/22/2016 0413   MCV 94.8 06/22/2016 0413   MCH 30.5 06/22/2016 0413   MCHC 32.2 06/22/2016 0413   RDW 13.7 06/22/2016 0413   LYMPHSABS 1.4 06/21/2016 1814   MONOABS 0.2 06/21/2016 1814   EOSABS 0.1 06/21/2016 1814   BASOSABS 0.0 06/21/2016 1814   HEPATIC Function Panel  Recent Labs  06/21/16 1814  PROT 6.4*   HEMOGLOBIN A1C No components found for: HGA1C,  MPG CARDIAC ENZYMES Lab Results  Component Value Date   CKTOTAL 41 05/15/2011   CKMB 2.3 05/15/2011   TROPONINI <0.03 09/26/2014   TROPONINI <0.30 01/10/2014   TROPONINI <0.30 01/09/2014   BNP No results for input(s): PROBNP in the last 8760 hours. TSH No results for input(s): TSH in the last 8760 hours. CHOLESTEROL No results for input(s): CHOL in the  last 8760 hours.  Scheduled Meds: . famotidine (PEPCID) IV  20 mg Intravenous Q24H  . furosemide  40 mg Intravenous Once  . levothyroxine  75 mcg Oral QAC breakfast  . pantoprazole  40 mg Oral Daily  . sodium chloride flush  3 mL Intravenous Q12H   Continuous Infusions:  PRN Meds:.sodium chloride, acetaminophen **OR** acetaminophen, artificial tears, ondansetron **OR** ondansetron (ZOFRAN) IV, sodium chloride flush  Assessment/Plan: Anemia r/o iron deficiency Hypertension Hypothyroidism CAD H/O CHF Bilateral leg edema  IV lasix x one. Increase activity. Change to observation status. Home in AM if stable.   LOS: 1 day    Dixie Dials  MD  06/22/2016, 3:50 PM

## 2016-06-23 DIAGNOSIS — I2510 Atherosclerotic heart disease of native coronary artery without angina pectoris: Secondary | ICD-10-CM | POA: Diagnosis not present

## 2016-06-23 DIAGNOSIS — I10 Essential (primary) hypertension: Secondary | ICD-10-CM | POA: Diagnosis not present

## 2016-06-23 DIAGNOSIS — R002 Palpitations: Secondary | ICD-10-CM | POA: Diagnosis not present

## 2016-06-23 DIAGNOSIS — R600 Localized edema: Secondary | ICD-10-CM | POA: Diagnosis not present

## 2016-06-23 DIAGNOSIS — I110 Hypertensive heart disease with heart failure: Secondary | ICD-10-CM | POA: Diagnosis not present

## 2016-06-23 DIAGNOSIS — E034 Atrophy of thyroid (acquired): Secondary | ICD-10-CM | POA: Diagnosis not present

## 2016-06-23 DIAGNOSIS — I5022 Chronic systolic (congestive) heart failure: Secondary | ICD-10-CM | POA: Diagnosis not present

## 2016-06-23 DIAGNOSIS — Z23 Encounter for immunization: Secondary | ICD-10-CM | POA: Diagnosis not present

## 2016-06-23 DIAGNOSIS — I509 Heart failure, unspecified: Secondary | ICD-10-CM | POA: Diagnosis not present

## 2016-06-23 DIAGNOSIS — R195 Other fecal abnormalities: Secondary | ICD-10-CM | POA: Diagnosis present

## 2016-06-23 DIAGNOSIS — D649 Anemia, unspecified: Secondary | ICD-10-CM | POA: Diagnosis not present

## 2016-06-23 LAB — CBC
HEMATOCRIT: 34.1 % — AB (ref 36.0–46.0)
Hemoglobin: 11 g/dL — ABNORMAL LOW (ref 12.0–15.0)
MCH: 30.6 pg (ref 26.0–34.0)
MCHC: 32.3 g/dL (ref 30.0–36.0)
MCV: 94.7 fL (ref 78.0–100.0)
PLATELETS: 192 10*3/uL (ref 150–400)
RBC: 3.6 MIL/uL — AB (ref 3.87–5.11)
RDW: 13.5 % (ref 11.5–15.5)
WBC: 3.1 10*3/uL — ABNORMAL LOW (ref 4.0–10.5)

## 2016-06-23 LAB — TSH: TSH: 5.299 u[IU]/mL — ABNORMAL HIGH (ref 0.350–4.500)

## 2016-06-23 MED ORDER — FAMOTIDINE 20 MG PO TABS: 20.0000 mg | ORAL_TABLET | Freq: Two times a day (BID) | ORAL | 3 refills | Status: DC

## 2016-06-23 NOTE — Discharge Summary (Signed)
Physician Discharge Summary  Patient ID: [name] MRN: [id number] DOB/AGE: 1916-09-16 82 y.o.  Admit date: 06/21/2016 Discharge date: 06/23/2016  Admission Diagnoses: Possible acute GI bleed Hypertension Hypothyroidism CAD H/O CHF Bilateral leg edema  Discharge Diagnoses:  Principal Problem: *Bilateral leg edema* Active Problems:   Hypertension   Hypothyroidism   CAD   H/O CHF   Moderate hiatal hernia   GI bleed ruled out  Discharged Condition: fair  Hospital Course: 82 year old female with past history of hypertension, hypothyroidism, CAD, CHF and osteoporosis noticed stools getting darker. She denied abdominal pain or dizziness. Occult blood test was negative x 1. She was taking Eliquis for DVT treatment in past. She has chronic leg edema and limited activity with use of a walker, hence Eliquis will be resumed and aspirin will be discontinued. Pepcid 20 mg. twice daily was started to decrease chance of GI bleed. She had good diuresis with 50 % reduction of leg edema in 1 day with one dose of IV lasix. She will see me in 1 week and primary care in 1 month.  Consults: None  Significant Diagnostic Studies: labs: Hgb 11.1, chronic leukopenia. Normal BMET. Normal Iron level, B-12 and folate levels. Dark stool was negative for occult blood.  EKG-Sinus bradycardia otherwise unremarkable.  Chest x-ray: Clear lungs with moderate hiatal hernia.  X-ray abdomen: unremarkable.  Treatments: Famotidine, Imdur, Eliquis, Lasix, Potassium and Eliuis  Discharge Exam: Blood pressure 116/72, pulse 62, temperature 98.1 F (36.7 C), temperature source Oral, resp. rate 18, height 5\' 1"  (1.549 m), weight 54.1 kg (119 lb 4.3 oz), SpO2 98 %. General appearance: alert, cooperative, appears stated age and no distress Head: Normocephalic, atraumatic Eyes: conjunctivae pale pink, corneas clear. PERRL, EOM's intact.  Neck: no adenopathy, no JVD, supple, symmetrical, trachea midline and thyroid  not enlarged. Resp: clear to auscultation bilaterally Cardio: regular rate and rhythm, S1, S2 normal, no click, rub or gallop. II/VI systolic murmur. GI: soft, non-tender; bowel sounds normal; no masses,  no organomegaly Extremities: extremities normal, atraumatic, no cyanosis or edema Skin: Warm and dry. Neurologic: Alert and oriented X 3, normal strength and tone. Normal coordination and unsteady gait requiring walker use.  Disposition: 01-Home or Self Care     Medication List    STOP taking these medications   aspirin 81 MG chewable tablet     TAKE these medications   acetaminophen 325 MG tablet Commonly known as:  TYLENOL Take 650 mg by mouth every 6 (six) hours as needed for mild pain.   denosumab 60 MG/ML Soln injection Commonly known as:  PROLIA Inject 60 mg into the skin every 6 (six) months. Administer in upper arm, thigh, or abdomen   ELIQUIS 2.5 MG Tabs tablet Generic drug:  apixaban Take 2.5 mg by mouth 2 (two) times daily.   famotidine 20 MG tablet Commonly known as:  PEPCID Take 1 tablet (20 mg total) by mouth 2 (two) times daily.   furosemide 40 MG tablet Commonly known as:  LASIX Take 1 tablet by mouth daily as needed for fluid or edema.   GOLD BOND EX Apply 1 application topically daily as needed (itching).   isosorbide mononitrate 30 MG 24 hr tablet Commonly known as:  IMDUR Take 30 mg by mouth daily.   levothyroxine 75 MCG tablet Commonly known as:  SYNTHROID, LEVOTHROID Take 75 mcg by mouth daily before breakfast.   losartan 50 MG tablet Commonly known as:  COZAAR Take 50 mg by mouth daily.  potassium chloride 10 MEQ tablet Commonly known as:  K-DUR,KLOR-CON Take 10 mEq by mouth daily.   SYSTANE OP Apply 1-2 drops to eye daily as needed (for eye dryness).   VITAMIN D PO Take 1 tablet by mouth daily as needed (Dietary supplement).      Follow-up Information    HOLWERDA, SCOTT, MD Follow up in 1 month(s).   Specialty:  Internal  Medicine Contact information: [address] [place] [address] [phone]        Birdie Riddle, MD. Schedule an appointment as soon as possible for a visit in 1 week(s).   Specialty:  Cardiology Contact information: [place] [address] [phone]           Signed: Birdie Riddle 06/23/2016, 12:09 PM

## 2016-07-02 DIAGNOSIS — E034 Atrophy of thyroid (acquired): Secondary | ICD-10-CM | POA: Diagnosis not present

## 2016-07-02 DIAGNOSIS — R072 Precordial pain: Secondary | ICD-10-CM | POA: Diagnosis not present

## 2016-07-02 DIAGNOSIS — M25562 Pain in left knee: Secondary | ICD-10-CM | POA: Diagnosis not present

## 2016-07-02 DIAGNOSIS — E559 Vitamin D deficiency, unspecified: Secondary | ICD-10-CM | POA: Diagnosis not present

## 2016-07-02 DIAGNOSIS — M25561 Pain in right knee: Secondary | ICD-10-CM | POA: Diagnosis not present

## 2016-07-09 DIAGNOSIS — M810 Age-related osteoporosis without current pathological fracture: Secondary | ICD-10-CM | POA: Diagnosis not present

## 2016-07-19 ENCOUNTER — Ambulatory Visit (HOSPITAL_COMMUNITY)
Admission: RE | Admit: 2016-07-19 | Discharge: 2016-07-19 | Disposition: A | Discharge status: Home / Self Care | Payer: Medicare Other | Source: Ambulatory Visit | Attending: Internal Medicine | Admitting: Internal Medicine

## 2016-08-02 ENCOUNTER — Ambulatory Visit (HOSPITAL_COMMUNITY)
Admission: RE | Admit: 2016-08-02 | Discharge: 2016-08-02 | Disposition: A | Discharge status: Home / Self Care | Payer: Medicare Other | Source: Ambulatory Visit | Attending: Internal Medicine | Admitting: Internal Medicine

## 2016-08-02 ENCOUNTER — Encounter (HOSPITAL_COMMUNITY): Payer: Self-pay

## 2016-08-02 DIAGNOSIS — M810 Age-related osteoporosis without current pathological fracture: Secondary | ICD-10-CM | POA: Diagnosis not present

## 2016-08-02 MED ORDER — DENOSUMAB 60 MG/ML ~~LOC~~ SOLN
60.0000 mg | Freq: Once | SUBCUTANEOUS | Status: AC
  Administered 2016-08-02: 60 mg via SUBCUTANEOUS
  Filled 2016-08-02: qty 1

## 2016-08-02 NOTE — Progress Notes (Signed)
Pt received prolia 60mg  sq.  Pt given d/c instructions about prolia.  Pt also given her next prolia appointment for Feb 03, 2017 11:00.  Pt d/c via wheelchair with caregiver to lobby.

## 2016-08-02 NOTE — Discharge Instructions (Signed)
Denosumab injection  What is this medicine?  DENOSUMAB (den oh sue mab) slows bone breakdown. Prolia is used to treat osteoporosis in women after menopause and in men. Xgeva is used to prevent bone fractures and other bone problems caused by cancer bone metastases. Xgeva is also used to treat giant cell tumor of the bone.  This medicine may be used for other purposes; ask your health care provider or pharmacist if you have questions.  What should I tell my health care provider before I take this medicine?  They need to know if you have any of these conditions:  -dental disease  -eczema  -infection or history of infections  -kidney disease or on dialysis  -low blood calcium or vitamin D  -malabsorption syndrome  -scheduled to have surgery or tooth extraction  -taking medicine that contains denosumab  -thyroid or parathyroid disease  -an unusual reaction to denosumab, other medicines, foods, dyes, or preservatives  -pregnant or trying to get pregnant  -breast-feeding  How should I use this medicine?  This medicine is for injection under the skin. It is given by a health care professional in a hospital or clinic setting.  If you are getting Prolia, a special MedGuide will be given to you by the pharmacist with each prescription and refill. Be sure to read this information carefully each time.  For Prolia, talk to your pediatrician regarding the use of this medicine in children. Special care may be needed. For Xgeva, talk to your pediatrician regarding the use of this medicine in children. While this drug may be prescribed for children as young as 13 years for selected conditions, precautions do apply.  Overdosage: If you think you have taken too much of this medicine contact a poison control center or emergency room at once.  NOTE: This medicine is only for you. Do not share this medicine with others.  What if I miss a dose?  It is important not to miss your dose. Call your doctor or health care professional if you are  unable to keep an appointment.  What may interact with this medicine?  Do not take this medicine with any of the following medications:  -other medicines containing denosumab  This medicine may also interact with the following medications:  -medicines that suppress the immune system  -medicines that treat cancer  -steroid medicines like prednisone or cortisone  This list may not describe all possible interactions. Give your health care provider a list of all the medicines, herbs, non-prescription drugs, or dietary supplements you use. Also tell them if you smoke, drink alcohol, or use illegal drugs. Some items may interact with your medicine.  What should I watch for while using this medicine?  Visit your doctor or health care professional for regular checks on your progress. Your doctor or health care professional may order blood tests and other tests to see how you are doing.  Call your doctor or health care professional if you get a cold or other infection while receiving this medicine. Do not treat yourself. This medicine may decrease your body's ability to fight infection.  You should make sure you get enough calcium and vitamin D while you are taking this medicine, unless your doctor tells you not to. Discuss the foods you eat and the vitamins you take with your health care professional.  See your dentist regularly. Brush and floss your teeth as directed. Before you have any dental work done, tell your dentist you are receiving this medicine.  Do   not become pregnant while taking this medicine or for 5 months after stopping it. Women should inform their doctor if they wish to become pregnant or think they might be pregnant. There is a potential for serious side effects to an unborn child. Talk to your health care professional or pharmacist for more information.  What side effects may I notice from receiving this medicine?  Side effects that you should report to your doctor or health care professional as soon as  possible:  -allergic reactions like skin rash, itching or hives, swelling of the face, lips, or tongue  -breathing problems  -chest pain  -fast, irregular heartbeat  -feeling faint or lightheaded, falls  -fever, chills, or any other sign of infection  -muscle spasms, tightening, or twitches  -numbness or tingling  -skin blisters or bumps, or is dry, peels, or red  -slow healing or unexplained pain in the mouth or jaw  -unusual bleeding or bruising  Side effects that usually do not require medical attention (Report these to your doctor or health care professional if they continue or are bothersome.):  -muscle pain  -stomach upset, gas  This list may not describe all possible side effects. Call your doctor for medical advice about side effects. You may report side effects to FDA at 1-800-FDA-1088.  Where should I keep my medicine?  This medicine is only given in a clinic, doctor's office, or other health care setting and will not be stored at home.  NOTE: This sheet is a summary. It may not cover all possible information. If you have questions about this medicine, talk to your doctor, pharmacist, or health care provider.      2016, Elsevier/Gold Standard. (2012-03-16 12:37:47)

## 2016-08-05 DIAGNOSIS — Z6822 Body mass index (BMI) 22.0-22.9, adult: Secondary | ICD-10-CM | POA: Diagnosis not present

## 2016-08-05 DIAGNOSIS — M109 Gout, unspecified: Secondary | ICD-10-CM | POA: Diagnosis not present

## 2016-08-05 DIAGNOSIS — I10 Essential (primary) hypertension: Secondary | ICD-10-CM | POA: Diagnosis not present

## 2016-08-05 DIAGNOSIS — M25562 Pain in left knee: Secondary | ICD-10-CM | POA: Diagnosis not present

## 2016-08-07 DIAGNOSIS — R072 Precordial pain: Secondary | ICD-10-CM | POA: Diagnosis not present

## 2016-08-07 DIAGNOSIS — E559 Vitamin D deficiency, unspecified: Secondary | ICD-10-CM | POA: Diagnosis not present

## 2016-08-07 DIAGNOSIS — M25561 Pain in right knee: Secondary | ICD-10-CM | POA: Diagnosis not present

## 2016-08-07 DIAGNOSIS — E034 Atrophy of thyroid (acquired): Secondary | ICD-10-CM | POA: Diagnosis not present

## 2016-08-07 DIAGNOSIS — M25562 Pain in left knee: Secondary | ICD-10-CM | POA: Diagnosis not present

## 2016-09-05 DIAGNOSIS — M25561 Pain in right knee: Secondary | ICD-10-CM | POA: Diagnosis not present

## 2016-09-05 DIAGNOSIS — E559 Vitamin D deficiency, unspecified: Secondary | ICD-10-CM | POA: Diagnosis not present

## 2016-09-05 DIAGNOSIS — M25562 Pain in left knee: Secondary | ICD-10-CM | POA: Diagnosis not present

## 2016-09-05 DIAGNOSIS — E034 Atrophy of thyroid (acquired): Secondary | ICD-10-CM | POA: Diagnosis not present

## 2016-09-05 DIAGNOSIS — R072 Precordial pain: Secondary | ICD-10-CM | POA: Diagnosis not present

## 2016-09-11 DIAGNOSIS — E785 Hyperlipidemia, unspecified: Secondary | ICD-10-CM | POA: Diagnosis not present

## 2016-09-11 DIAGNOSIS — E876 Hypokalemia: Secondary | ICD-10-CM | POA: Diagnosis not present

## 2016-09-11 DIAGNOSIS — E559 Vitamin D deficiency, unspecified: Secondary | ICD-10-CM | POA: Diagnosis not present

## 2016-09-11 DIAGNOSIS — E034 Atrophy of thyroid (acquired): Secondary | ICD-10-CM | POA: Diagnosis not present

## 2016-09-11 DIAGNOSIS — D649 Anemia, unspecified: Secondary | ICD-10-CM | POA: Diagnosis not present

## 2016-09-11 DIAGNOSIS — Z79899 Other long term (current) drug therapy: Secondary | ICD-10-CM | POA: Diagnosis not present

## 2017-02-03 ENCOUNTER — Ambulatory Visit (HOSPITAL_COMMUNITY)
Admission: RE | Admit: 2017-02-03 | Discharge: 2017-02-03 | Disposition: A | Discharge status: Home / Self Care | Payer: Medicare Other | Source: Ambulatory Visit | Attending: Internal Medicine | Admitting: Internal Medicine

## 2017-02-08 ENCOUNTER — Encounter (HOSPITAL_COMMUNITY): Payer: Self-pay | Admitting: Emergency Medicine

## 2017-02-08 ENCOUNTER — Emergency Department (HOSPITAL_COMMUNITY): Payer: Medicare Other

## 2017-02-08 ENCOUNTER — Emergency Department (HOSPITAL_COMMUNITY)
Admission: EM | Admit: 2017-02-08 | Discharge: 2017-02-08 | Disposition: A | Discharge status: Home / Self Care | Payer: Medicare Other | Attending: Emergency Medicine | Admitting: Emergency Medicine

## 2017-02-08 DIAGNOSIS — R079 Chest pain, unspecified: Secondary | ICD-10-CM

## 2017-02-08 DIAGNOSIS — Z7901 Long term (current) use of anticoagulants: Secondary | ICD-10-CM | POA: Diagnosis not present

## 2017-02-08 DIAGNOSIS — I208 Other forms of angina pectoris: Secondary | ICD-10-CM | POA: Diagnosis not present

## 2017-02-08 DIAGNOSIS — Z79899 Other long term (current) drug therapy: Secondary | ICD-10-CM | POA: Insufficient documentation

## 2017-02-08 DIAGNOSIS — E039 Hypothyroidism, unspecified: Secondary | ICD-10-CM | POA: Insufficient documentation

## 2017-02-08 DIAGNOSIS — I509 Heart failure, unspecified: Secondary | ICD-10-CM | POA: Insufficient documentation

## 2017-02-08 DIAGNOSIS — I110 Hypertensive heart disease with heart failure: Secondary | ICD-10-CM | POA: Insufficient documentation

## 2017-02-08 DIAGNOSIS — R0789 Other chest pain: Secondary | ICD-10-CM | POA: Diagnosis not present

## 2017-02-08 LAB — I-STAT TROPONIN, ED
Troponin i, poc: 0.01 ng/mL (ref 0.00–0.08)
Troponin i, poc: 0.02 ng/mL (ref 0.00–0.08)

## 2017-02-08 LAB — CBC
HEMATOCRIT: 34.7 % — AB (ref 36.0–46.0)
HEMOGLOBIN: 11.2 g/dL — AB (ref 12.0–15.0)
MCH: 30.2 pg (ref 26.0–34.0)
MCHC: 32.3 g/dL (ref 30.0–36.0)
MCV: 93.5 fL (ref 78.0–100.0)
PLATELETS: 182 10*3/uL (ref 150–400)
RBC: 3.71 MIL/uL — AB (ref 3.87–5.11)
RDW: 13.4 % (ref 11.5–15.5)
WBC: 3.4 10*3/uL — ABNORMAL LOW (ref 4.0–10.5)

## 2017-02-08 LAB — BASIC METABOLIC PANEL
Anion gap: 10 (ref 5–15)
BUN: 23 mg/dL — ABNORMAL HIGH (ref 6–20)
CHLORIDE: 103 mmol/L (ref 101–111)
CO2: 27 mmol/L (ref 22–32)
CREATININE: 1.03 mg/dL — AB (ref 0.44–1.00)
Calcium: 8.7 mg/dL — ABNORMAL LOW (ref 8.9–10.3)
GFR calc non Af Amer: 43 mL/min — ABNORMAL LOW (ref >60)
GFR, EST AFRICAN AMERICAN: 50 mL/min — AB (ref >60)
Glucose, Bld: 93 mg/dL (ref 65–99)
Potassium: 3.7 mmol/L (ref 3.5–5.1)
Sodium: 140 mmol/L (ref 135–145)

## 2017-02-08 MED ORDER — ACETAMINOPHEN 325 MG PO TABS
650.0000 mg | ORAL_TABLET | Freq: Once | ORAL | Status: AC
  Administered 2017-02-08: 650 mg via ORAL
  Filled 2017-02-08: qty 2

## 2017-02-08 NOTE — ED Notes (Signed)
Pt helped getting dressed and pt called nephew who states he will be here soon pt wheel out to lobby in wheelchair to wait on nephew, Tech first made aware to help pt to car once family arrives.

## 2017-02-08 NOTE — ED Notes (Signed)
Patient transported to X-ray 

## 2017-02-08 NOTE — ED Triage Notes (Signed)
Pt presents from home with [facility] for CP with vague description; pt denies sob, n/v; EMS reports elevation in V2 only; pt given 324mg  ASA; pt also has red large rash to back and flank area bilat

## 2017-02-08 NOTE — ED Provider Notes (Signed)
[facility] DEPT Provider Note   CSN: [id number] Arrival date & time: 02/08/17  6301     History   Chief Complaint Chief Complaint  Patient presents with  . Chest Pain    HPI [name] is a 83 y.o. female.  Patient with history CAD, CHF, HTN, thyroid disease, osteoporosis presents with left sided chest pain. She states she has left sided chest pain on and off but felt it was sharper tonight than usual. She feels SOB at times with her pain but not tonight. No nausea or vomiting. She complains of pain in bilateral knees and ankles, and LE swelling which is not as bad tonight as it usually is.    The history is provided by the patient. No language interpreter was used.  Chest Pain   Pertinent negatives include no abdominal pain, no cough, no diaphoresis, no fever, no nausea, no shortness of breath and no weakness.    Past Medical History:  Diagnosis Date  . Arthritis   . CHF (congestive heart failure) ([place])   . Coronary artery disease   . Gout   . Hypertension   . Hypothyroidism   . Osteoporosis   . Shortness of breath   . Thyroid disease     Patient Active Problem List   Diagnosis Date Noted  . GI bleeding 06/21/2016  . Bilateral leg edema 06/21/2016  . Acute GI bleeding 06/21/2016  . Uncontrolled hypertension 03/09/2014    Past Surgical History:  Procedure Laterality Date  . ABDOMINAL HYSTERECTOMY    . APPENDECTOMY    . CARPAL TUNNEL RELEASE Right   . TONSILLECTOMY      OB History    No data available       Home Medications    Prior to Admission medications   Medication Sig Start Date End Date Taking? Authorizing Provider  acetaminophen (TYLENOL) 325 MG tablet Take 650 mg by mouth every 6 (six) hours as needed for mild pain.    [provider]  apixaban (ELIQUIS) 2.5 MG TABS tablet Take 2.5 mg by mouth 2 (two) times daily.    [provider]  Cholecalciferol (VITAMIN D PO) Take 1 tablet by mouth daily as needed (Dietary  supplement).     [provider]  denosumab (PROLIA) 60 MG/ML SOLN injection Inject 60 mg into the skin every 6 (six) months. Administer in upper arm, thigh, or abdomen    [provider]  famotidine (PEPCID) 20 MG tablet Take 1 tablet (20 mg total) by mouth 2 (two) times daily. 06/23/16   Dixie Dials, MD  furosemide (LASIX) 40 MG tablet Take 20 mg by mouth daily as needed for fluid or edema.  01/17/15   [provider]  isosorbide mononitrate (IMDUR) 30 MG 24 hr tablet Take 30 mg by mouth daily. 12/23/13   [provider]  levothyroxine (SYNTHROID, LEVOTHROID) 75 MCG tablet Take 75 mcg by mouth daily before breakfast.    [provider]  losartan (COZAAR) 50 MG tablet Take 50 mg by mouth daily.    [provider]  Menthol-Zinc Oxide (GOLD BOND EX) Apply 1 application topically daily as needed (itching).    [provider]  Polyethyl Glycol-Propyl Glycol (SYSTANE OP) Apply 1-2 drops to eye daily as needed (for eye dryness).    [provider]  potassium chloride (K-DUR,KLOR-CON) 10 MEQ tablet Take 10 mEq by mouth daily.     [provider]    Family History History reviewed. No pertinent family  history.  Social History Social History  Substance Use Topics  . Smoking status: Never Smoker  . Smokeless tobacco: Never Used  . Alcohol use No     Allergies   Crestor [rosuvastatin]   Review of Systems Review of Systems  Constitutional: Negative for chills, diaphoresis and fever.  Respiratory: Negative.  Negative for cough and shortness of breath.   Cardiovascular: Positive for chest pain and leg swelling.  Gastrointestinal: Negative.  Negative for abdominal pain and nausea.  Musculoskeletal: Negative.   Skin: Negative.   Neurological: Negative.  Negative for weakness and light-headedness.     Physical Exam Updated Vital Signs BP (!) 134/56   Pulse 61   Resp 17   SpO2 100%   Physical Exam    Constitutional: She is oriented to person, place, and time. She appears well-developed and well-nourished.  Pleasant 83 yo patient who is completely oriented.  HENT:  Head: Normocephalic.  Neck: Normal range of motion. Neck supple.  Cardiovascular: Normal rate, regular rhythm and intact distal pulses.   Pulmonary/Chest: Effort normal and breath sounds normal. She has no rales. She exhibits no tenderness.  Abdominal: Soft. Bowel sounds are normal. There is no tenderness. There is no rebound and no guarding.  Musculoskeletal: Normal range of motion. She exhibits no edema.  Neurological: She is alert and oriented to person, place, and time.  Skin: Skin is warm and dry. No rash noted.  Psychiatric: She has a normal mood and affect.     ED Treatments / Results  Labs (all labs ordered are listed, but only abnormal results are displayed) Labs Reviewed  BASIC METABOLIC PANEL - Abnormal; Notable for the following:       Result Value   BUN 23 (*)    Creatinine, Ser 1.03 (*)    Calcium 8.7 (*)    GFR calc non Af Amer 43 (*)    GFR calc Af Amer 50 (*)    All other components within normal limits  CBC - Abnormal; Notable for the following:    WBC 3.4 (*)    RBC 3.71 (*)    Hemoglobin 11.2 (*)    HCT 34.7 (*)    All other components within normal limits  I-STAT TROPOININ, ED    EKG  EKG Interpretation None       Radiology No results found.  Procedures Procedures (including critical care time)  Medications Ordered in ED Medications - No data to display   Initial Impression / Assessment and Plan / ED Course  I have reviewed the triage vital signs and the nursing notes.  Pertinent labs & imaging results that were available during my care of the patient were reviewed by me and considered in my medical decision making (see chart for details).     Patient presents with chest pain similar to regularly recurrent chest pain. She is followed by Dr. Doylene Canard. No pain  currently.  Plan: repeat EKG and delta troponin, due at 6:00.  Patient continues to be pain free. Delta trop and repeat EKG negative. She is felt appropriate for discharge home. Encouraged to follow up with Dr. Doylene Canard in the outpatient setting.  Final Clinical Impressions(s) / ED Diagnoses   Final diagnoses:  None   1. Stable angina  New Prescriptions New Prescriptions   No medications on file     Dennie Bible 02/08/17 [place], MD 02/08/17 [phone]

## 2017-06-06 ENCOUNTER — Encounter (HOSPITAL_COMMUNITY): Payer: Self-pay

## 2017-06-06 ENCOUNTER — Ambulatory Visit (HOSPITAL_COMMUNITY)
Admission: RE | Admit: 2017-06-06 | Discharge: 2017-06-06 | Disposition: A | Discharge status: Home / Self Care | Payer: Medicare Other | Source: Ambulatory Visit | Attending: Internal Medicine | Admitting: Internal Medicine

## 2017-06-06 DIAGNOSIS — M810 Age-related osteoporosis without current pathological fracture: Secondary | ICD-10-CM | POA: Insufficient documentation

## 2017-06-06 MED ORDER — DENOSUMAB 60 MG/ML ~~LOC~~ SOLN
60.0000 mg | Freq: Once | SUBCUTANEOUS | Status: AC
Start: 2017-06-06 — End: 2017-06-06
  Administered 2017-06-06: 60 mg via SUBCUTANEOUS
  Filled 2017-06-06: qty 1

## 2017-06-06 NOTE — Discharge Instructions (Signed)
Denosumab injection °What is this medicine? °DENOSUMAB (den oh sue mab) slows bone breakdown. Prolia is used to treat osteoporosis in women after menopause and in men. Xgeva is used to treat a high calcium level due to cancer and to prevent bone fractures and other bone problems caused by multiple myeloma or cancer bone metastases. Xgeva is also used to treat giant cell tumor of the bone. °This medicine may be used for other purposes; ask your health care provider or pharmacist if you have questions. °COMMON BRAND NAME(S): Prolia, XGEVA °What should I tell my health care provider before I take this medicine? °They need to know if you have any of these conditions: °-dental disease °-having surgery or tooth extraction °-infection °-kidney disease °-low levels of calcium or Vitamin D in the blood °-malnutrition °-on hemodialysis °-skin conditions or sensitivity °-thyroid or parathyroid disease °-an unusual reaction to denosumab, other medicines, foods, dyes, or preservatives °-pregnant or trying to get pregnant °-breast-feeding °How should I use this medicine? °This medicine is for injection under the skin. It is given by a health care professional in a hospital or clinic setting. °If you are getting Prolia, a special MedGuide will be given to you by the pharmacist with each prescription and refill. Be sure to read this information carefully each time. °For Prolia, talk to your pediatrician regarding the use of this medicine in children. Special care may be needed. For Xgeva, talk to your pediatrician regarding the use of this medicine in children. While this drug may be prescribed for children as young as 13 years for selected conditions, precautions do apply. °Overdosage: If you think you have taken too much of this medicine contact a poison control center or emergency room at once. °NOTE: This medicine is only for you. Do not share this medicine with others. °What if I miss a dose? °It is important not to miss your  dose. Call your doctor or health care professional if you are unable to keep an appointment. °What may interact with this medicine? °Do not take this medicine with any of the following medications: °-other medicines containing denosumab °This medicine may also interact with the following medications: °-medicines that lower your chance of fighting infection °-steroid medicines like prednisone or cortisone °This list may not describe all possible interactions. Give your health care provider a list of all the medicines, herbs, non-prescription drugs, or dietary supplements you use. Also tell them if you smoke, drink alcohol, or use illegal drugs. Some items may interact with your medicine. °What should I watch for while using this medicine? °Visit your doctor or health care professional for regular checks on your progress. Your doctor or health care professional may order blood tests and other tests to see how you are doing. °Call your doctor or health care professional for advice if you get a fever, chills or sore throat, or other symptoms of a cold or flu. Do not treat yourself. This drug may decrease your body's ability to fight infection. Try to avoid being around people who are sick. °You should make sure you get enough calcium and vitamin D while you are taking this medicine, unless your doctor tells you not to. Discuss the foods you eat and the vitamins you take with your health care professional. °See your dentist regularly. Brush and floss your teeth as directed. Before you have any dental work done, tell your dentist you are receiving this medicine. °Do not become pregnant while taking this medicine or for 5 months after stopping   it. Talk with your doctor or health care professional about your birth control options while taking this medicine. Women should inform their doctor if they wish to become pregnant or think they might be pregnant. There is a potential for serious side effects to an unborn child. Talk  to your health care professional or pharmacist for more information. What side effects may I notice from receiving this medicine? Side effects that you should report to your doctor or health care professional as soon as possible: -allergic reactions like skin rash, itching or hives, swelling of the face, lips, or tongue -bone pain -breathing problems -dizziness -jaw pain, especially after dental work -redness, blistering, peeling of the skin -signs and symptoms of infection like fever or chills; cough; sore throat; pain or trouble passing urine -signs of low calcium like fast heartbeat, muscle cramps or muscle pain; pain, tingling, numbness in the hands or feet; seizures -unusual bleeding or bruising -unusually weak or tired Side effects that usually do not require medical attention (report to your doctor or health care professional if they continue or are bothersome): -constipation -diarrhea -headache -joint pain -loss of appetite -muscle pain -runny nose -tiredness -upset stomach This list may not describe all possible side effects. Call your doctor for medical advice about side effects. You may report side effects to FDA at 1-800-FDA-1088. Where should I keep my medicine? This medicine is only given in a clinic, doctor's office, or other health care setting and will not be stored at home. NOTE: This sheet is a summary. It may not cover all possible information. If you have questions about this medicine, talk to your doctor, pharmacist, or health care provider.  2018 Elsevier/Gold Standard (2016-10-08 19:17:21)

## 2017-06-06 NOTE — Progress Notes (Signed)
prolia given to rt upper outer arm SQ 60mg .  Pt has had before.  Calcium level normal.  Takes daily vitamin D.  Next appointment given for March 2019. D/c instructions on prolia given to pt.   Pt d/c via wheelchair to lobby with family member.

## 2017-06-16 IMAGING — DX DG CHEST 2V
2 series · 2 of 2 positions shown · non-contrast
Comparison: 09/26/2014

CLINICAL DATA: Acute GI bleed

EXAM:
CHEST  2 VIEW

[chest pa]
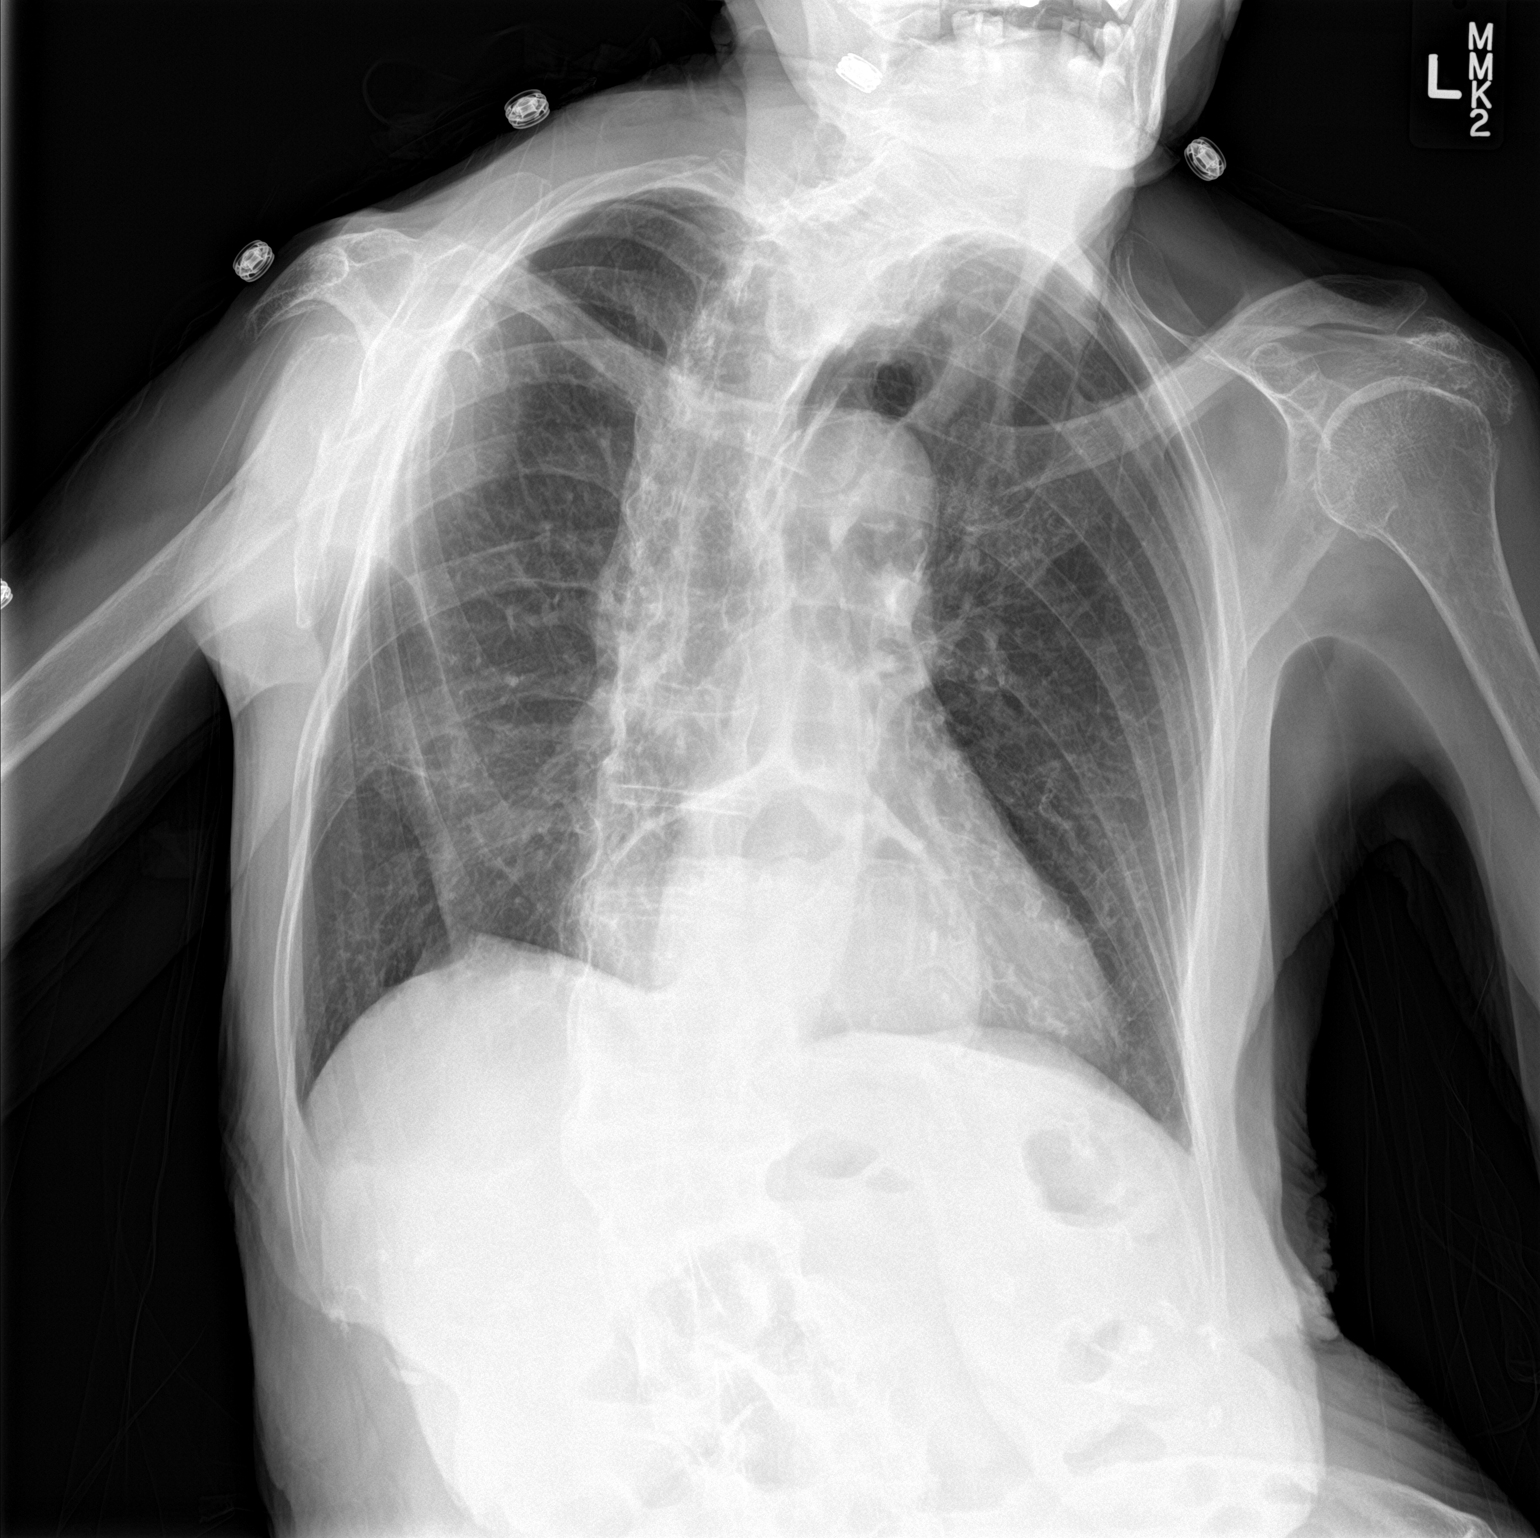

[chest lat]
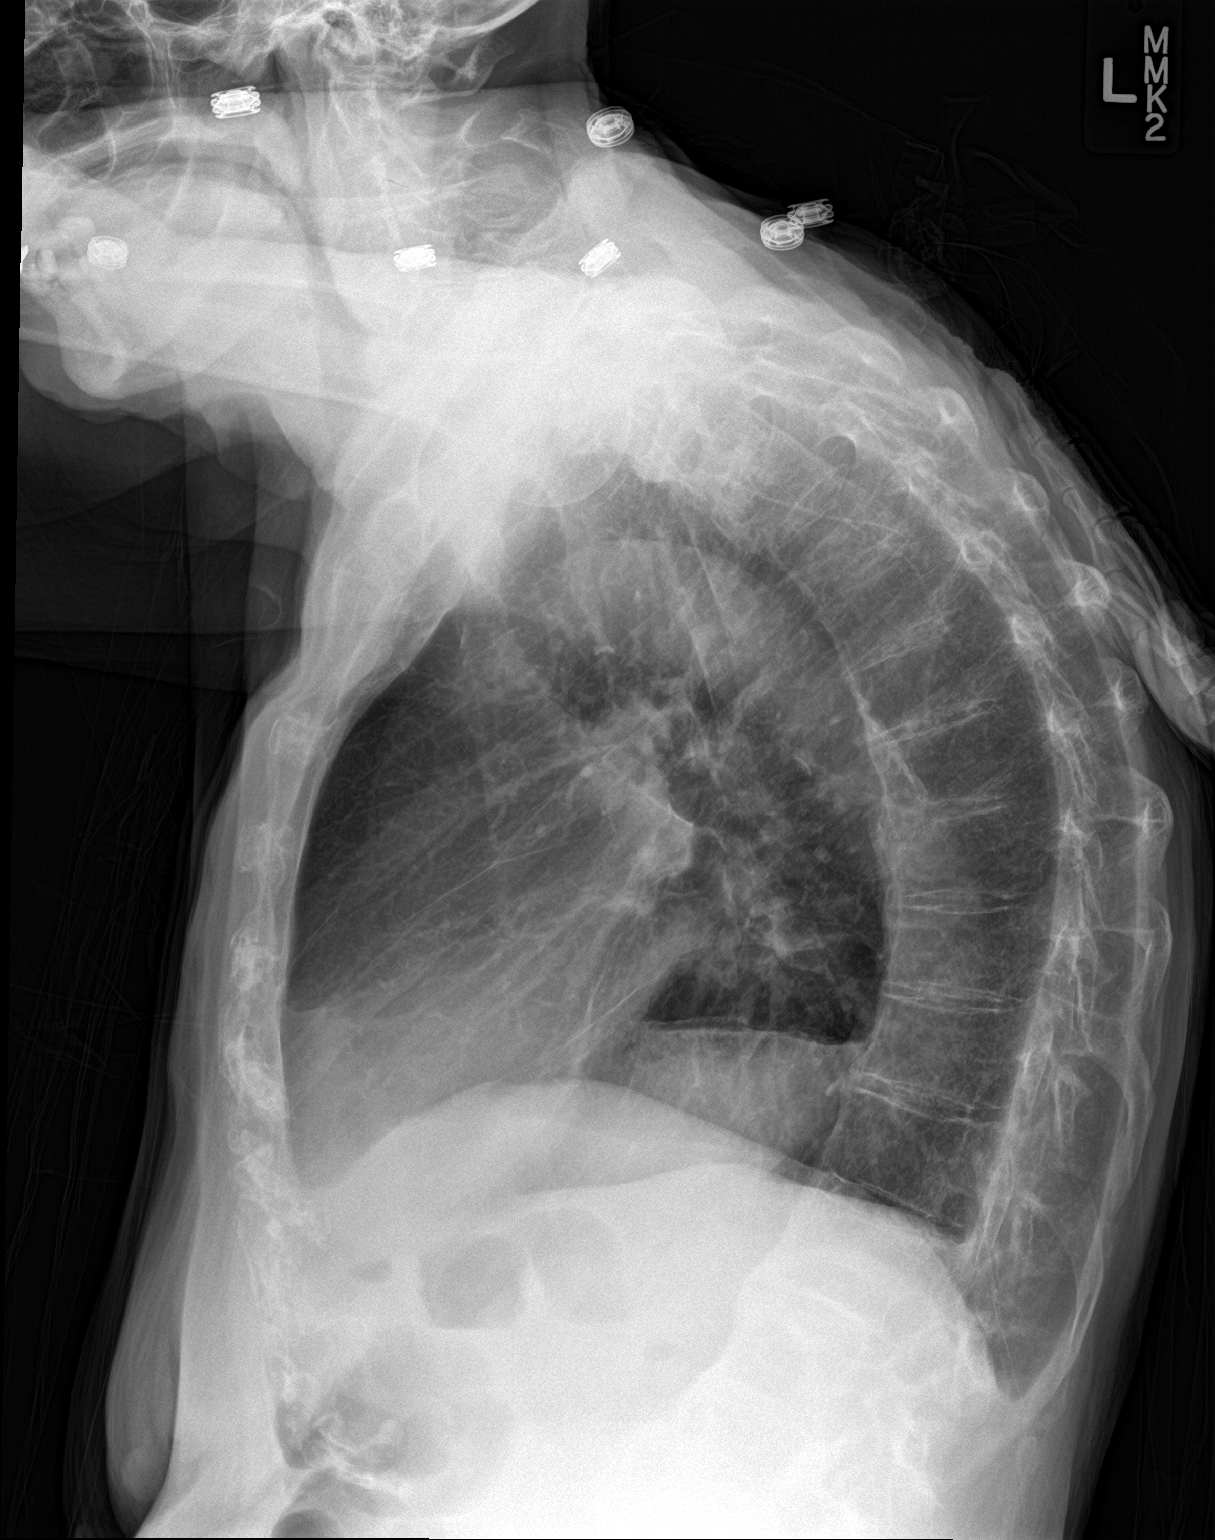

[2 of 2 positions shown; findings below may reference images not displayed]

FINDINGS: Lungs are clear.  No pleural effusion or pneumothorax.

The heart is normal in size.

Moderate hiatal hernia.

Degenerative changes of the visualized thoracolumbar spine.
IMPRESSION: No evidence of acute cardiopulmonary disease.

## 2017-08-14 ENCOUNTER — Other Ambulatory Visit: Payer: Self-pay

## 2017-08-14 ENCOUNTER — Emergency Department (HOSPITAL_COMMUNITY)
Admission: EM | Admit: 2017-08-14 | Discharge: 2017-08-14 | Disposition: A | Discharge status: Home / Self Care | Payer: Medicare Other | Attending: Emergency Medicine | Admitting: Emergency Medicine

## 2017-08-14 ENCOUNTER — Emergency Department (HOSPITAL_COMMUNITY): Payer: Medicare Other

## 2017-08-14 DIAGNOSIS — R531 Weakness: Secondary | ICD-10-CM

## 2017-08-14 DIAGNOSIS — I2510 Atherosclerotic heart disease of native coronary artery without angina pectoris: Secondary | ICD-10-CM | POA: Insufficient documentation

## 2017-08-14 DIAGNOSIS — R0602 Shortness of breath: Secondary | ICD-10-CM

## 2017-08-14 DIAGNOSIS — I110 Hypertensive heart disease with heart failure: Secondary | ICD-10-CM | POA: Diagnosis not present

## 2017-08-14 DIAGNOSIS — E039 Hypothyroidism, unspecified: Secondary | ICD-10-CM | POA: Insufficient documentation

## 2017-08-14 DIAGNOSIS — Z79899 Other long term (current) drug therapy: Secondary | ICD-10-CM | POA: Diagnosis not present

## 2017-08-14 DIAGNOSIS — Z7901 Long term (current) use of anticoagulants: Secondary | ICD-10-CM | POA: Diagnosis not present

## 2017-08-14 DIAGNOSIS — I5030 Unspecified diastolic (congestive) heart failure: Secondary | ICD-10-CM | POA: Diagnosis not present

## 2017-08-14 DIAGNOSIS — R600 Localized edema: Secondary | ICD-10-CM | POA: Insufficient documentation

## 2017-08-14 LAB — CBC WITH DIFFERENTIAL/PLATELET
BASOS PCT: 1 %
Basophils Absolute: 0 10*3/uL (ref 0.0–0.1)
EOS ABS: 0 10*3/uL (ref 0.0–0.7)
Eosinophils Relative: 2 %
HCT: 37.3 % (ref 36.0–46.0)
Hemoglobin: 12.1 g/dL (ref 12.0–15.0)
Lymphocytes Relative: 48 %
Lymphs Abs: 1.2 10*3/uL (ref 0.7–4.0)
MCH: 30.6 pg (ref 26.0–34.0)
MCHC: 32.4 g/dL (ref 30.0–36.0)
MCV: 94.2 fL (ref 78.0–100.0)
MONO ABS: 0.2 10*3/uL (ref 0.1–1.0)
MONOS PCT: 8 %
NEUTROS PCT: 41 %
Neutro Abs: 1 10*3/uL — ABNORMAL LOW (ref 1.7–7.7)
PLATELETS: 192 10*3/uL (ref 150–400)
RBC: 3.96 MIL/uL (ref 3.87–5.11)
RDW: 13.6 % (ref 11.5–15.5)
WBC: 2.5 10*3/uL — ABNORMAL LOW (ref 4.0–10.5)

## 2017-08-14 LAB — COMPREHENSIVE METABOLIC PANEL
ALBUMIN: 3.8 g/dL (ref 3.5–5.0)
ALT: 9 U/L — ABNORMAL LOW (ref 14–54)
ANION GAP: 7 (ref 5–15)
AST: 20 U/L (ref 15–41)
Alkaline Phosphatase: 52 U/L (ref 38–126)
BUN: 18 mg/dL (ref 6–20)
CALCIUM: 8.8 mg/dL — AB (ref 8.9–10.3)
CO2: 25 mmol/L (ref 22–32)
CREATININE: 1.02 mg/dL — AB (ref 0.44–1.00)
Chloride: 105 mmol/L (ref 101–111)
GFR calc non Af Amer: 44 mL/min — ABNORMAL LOW (ref >60)
GFR, EST AFRICAN AMERICAN: 51 mL/min — AB (ref >60)
Glucose, Bld: 87 mg/dL (ref 65–99)
Potassium: 3.9 mmol/L (ref 3.5–5.1)
SODIUM: 137 mmol/L (ref 135–145)
TOTAL PROTEIN: 7.2 g/dL (ref 6.5–8.1)
Total Bilirubin: 1.1 mg/dL (ref 0.3–1.2)

## 2017-08-14 LAB — TSH: TSH: 4.685 u[IU]/mL — AB (ref 0.350–4.500)

## 2017-08-14 LAB — BRAIN NATRIURETIC PEPTIDE: B Natriuretic Peptide: 117.3 pg/mL — ABNORMAL HIGH (ref 0.0–100.0)

## 2017-08-14 LAB — T4, FREE: FREE T4: 1.21 ng/dL — AB (ref 0.61–1.12)

## 2017-08-14 MED ORDER — SODIUM CHLORIDE 0.9 % IV BOLUS (SEPSIS)
500.0000 mL | Freq: Once | INTRAVENOUS | Status: AC
  Administered 2017-08-14: 500 mL via INTRAVENOUS

## 2017-08-14 NOTE — Care Management Note (Signed)
Case Management Note  Patient Details  Name: [name] MRN: [id number] Date of Birth: 27-Jan-1916  Subjective/Objective:                  83 y.o. female with a history of hypertension, CAD, peripheral edema, diastolic heart failure, hypothyroidism who presents to the emergency department with several days of worsening fatigue and dyspnea on exertion.  Action/Plan: CM consulted for HHS.  Pt is from home and has a cousin who stays the night with her but she is alone during the day.  Spoke with pt and family who choose [facility] who was unable to accept the pt.  Second choice was [facility].  Spoke with Crystal who accepted the referral.  No further CM needs noted at this time.  Expected Discharge Date:   08/14/2017               Expected Discharge Plan:  [place]  Discharge planning Services  CM Consult  Post Acute Care Choice:  Home Health Choice offered to:  Patient, Adult Children  HH Arranged:  RN, PT, OT, Nurse's Aide, Social Work [employer] Agency: [employer]  Status of Service:  Completed, signed off  Kamill Fulbright, Benjaman Lobe, RN 08/14/2017, 6:06 PM

## 2017-08-14 NOTE — ED Provider Notes (Addendum)
[facility] DEPT Provider Note  CSN: [id number] Arrival date & time: 08/14/17 1051  Chief Complaint(s) Weakness  HPI [name] is a 83 y.o. female with a history of hypertension, CAD, peripheral edema, diastolic heart failure, hypothyroidism who presents to the emergency department with several days of worsening fatigue and dyspnea on exertion.  Patient also endorsing orthostasis which was severe this morning.  She reports checking her blood pressure and noticing that her blood pressure was running a bit low around 100/70.  She has also been noticing increased lower extremity edema.  Denies any recent fevers, infections, headache, focal weakness, chest pain, nausea, vomiting, abdominal pain, diarrhea.    She denies any melena or hematochezia. She denies any urinary symptoms.  She denies any change in her Lasix dosing.  HPI  Past Medical History Past Medical History:  Diagnosis Date  . Arthritis   . CHF (congestive heart failure) ([place])   . Coronary artery disease   . Gout   . Hypertension   . Hypothyroidism   . Osteoporosis   . Shortness of breath   . Thyroid disease    Patient Active Problem List   Diagnosis Date Noted  . GI bleeding 06/21/2016  . Bilateral leg edema 06/21/2016  . Acute GI bleeding 06/21/2016  . Uncontrolled hypertension 03/09/2014   Home Medication(s) Prior to Admission medications   Medication Sig Start Date End Date Taking? Authorizing Provider  apixaban (ELIQUIS) 2.5 MG TABS tablet Take 2.5 mg by mouth 2 (two) times daily.   Yes [provider]  Cholecalciferol (VITAMIN D PO) Take 1 tablet by mouth daily as needed (Dietary supplement).    Yes [provider]  denosumab (PROLIA) 60 MG/ML SOLN injection Inject 60 mg into the skin every 6 (six) months. Administer in upper arm, thigh, or abdomen   Yes [provider]  furosemide (LASIX) 40 MG tablet Take 20 mg by mouth daily as needed for fluid  or edema.  01/17/15  Yes [provider]  levothyroxine (SYNTHROID, LEVOTHROID) 75 MCG tablet Take 75 mcg by mouth daily before breakfast.   Yes [provider]  losartan (COZAAR) 50 MG tablet Take 50 mg by mouth daily.   Yes [provider]  Polyethyl Glycol-Propyl Glycol (SYSTANE OP) Apply 1-2 drops to eye daily as needed (for eye dryness).   Yes [provider]  potassium chloride (K-DUR,KLOR-CON) 10 MEQ tablet Take 10 mEq by mouth daily.    Yes [provider]  famotidine (PEPCID) 20 MG tablet Take 1 tablet (20 mg total) by mouth 2 (two) times daily. Patient not taking: Reported on 08/14/2017 06/23/16   Dixie Dials, MD                                                                                                                                    Past Surgical History Past Surgical History:  Procedure Laterality Date  .  ABDOMINAL HYSTERECTOMY    . APPENDECTOMY    . CARPAL TUNNEL RELEASE Right   . TONSILLECTOMY     Family History No family history on file.  Social History Social History   Tobacco Use  . Smoking status: Never Smoker  . Smokeless tobacco: Never Used  Substance Use Topics  . Alcohol use: No  . Drug use: No   Allergies Crestor [rosuvastatin]  Review of Systems Review of Systems All other systems are reviewed and are negative for acute change except as noted in the HPI  Physical Exam Vital Signs  I have reviewed the triage vital signs BP (!) 148/62 (BP Location: Left Arm)   Pulse 67   Temp (!) 97.3 F (36.3 C) (Oral)   Resp 18   Ht 5\' 3"  (1.6 m)   Wt 52.2 kg (115 lb)   SpO2 99%   BMI 20.37 kg/m   Physical Exam  Constitutional: She is oriented to person, place, and time. She appears well-developed and well-nourished. No distress.  HENT:  Head: Normocephalic and atraumatic.  Nose: Nose normal.  Eyes: Conjunctivae and EOM are normal. Pupils are equal, round, and reactive to light. Right eye exhibits no  discharge. Left eye exhibits no discharge. No scleral icterus.  Neck: Normal range of motion. Neck supple.  Cardiovascular: Normal rate and regular rhythm. Exam reveals no gallop and no friction rub.  No murmur heard. Pulmonary/Chest: Effort normal and breath sounds normal. No stridor. No respiratory distress. She has no rales.  Abdominal: Soft. She exhibits no distension. There is no tenderness.  Musculoskeletal: She exhibits no edema or tenderness.  2 + bilateral lower extremity edema.  Neurological: She is alert and oriented to person, place, and time.  Skin: Skin is warm and dry. No rash noted. She is not diaphoretic. No erythema.  Psychiatric: She has a normal mood and affect.  Vitals reviewed.   ED Results and Treatments Labs (all labs ordered are listed, but only abnormal results are displayed) Labs Reviewed  CBC WITH DIFFERENTIAL/PLATELET - Abnormal; Notable for the following components:      Result Value   WBC 2.5 (*)    Neutro Abs 1.0 (*)    All other components within normal limits  COMPREHENSIVE METABOLIC PANEL - Abnormal; Notable for the following components:   Creatinine, Ser 1.02 (*)    Calcium 8.8 (*)    ALT 9 (*)    GFR calc non Af Amer 44 (*)    GFR calc Af Amer 51 (*)    All other components within normal limits  BRAIN NATRIURETIC PEPTIDE - Abnormal; Notable for the following components:   B Natriuretic Peptide 117.3 (*)    All other components within normal limits  TSH - Abnormal; Notable for the following components:   TSH 4.685 (*)    All other components within normal limits  T4, FREE - Abnormal; Notable for the following components:   Free T4 1.21 (*)    All other components within normal limits  EKG  EKG Interpretation  Date/Time:  Thursday August 14 2017 11:17:35 EST Ventricular Rate:  70 PR Interval:    QRS Duration: 88 QT  Interval:  396 QTC Calculation: 428 R Axis:   52 Text Interpretation:  Sinus rhythm Abnormal R-wave progression, early transition Minimal ST elevation, inferior leads Baseline wander in lead(s) V3 Otherwise no significant change Confirmed by Addison Lank [phone]) on 08/14/2017 11:38:04 AM      Radiology Dg Chest 2 View  Result Date: 08/14/2017 CLINICAL DATA:  Increase weakness.  Shortness of breath. EXAM: CHEST  2 VIEW COMPARISON:  02/08/2017. FINDINGS: Mediastinum and hilar structures normal. Lungs are clear. Mild basilar atelectasis. Small right pleural effusion. Heart size normal. Sliding hiatal hernia. IMPRESSION: 1. Mild basilar atelectasis.  Small right pleural effusion. 2. Sliding hiatal hernia. Electronically Signed   By: Marcello Moores  Register   On: 08/14/2017 12:10   Pertinent labs & imaging results that were available during my care of the patient were reviewed by me and considered in my medical decision making (see chart for details).  Medications Ordered in ED Medications  sodium chloride 0.9 % bolus 500 mL (500 mLs Intravenous New Bag/Given 08/14/17 1341)                                                                                                                                    Procedures Procedures  (including critical care time)  Medical Decision Making / ED Course I have reviewed the nursing notes for this encounter and the patient's prior records (if available in EHR or on provided paperwork).    Labs grossly reassuring and at patient's baseline.  Workup not consistent with CHF exacerbation, myxedema coma.  Likely secondary to dehydration from over diuresing.  Patient was provided with a small IV fluid bolus which significantly improved her fatigue.  Patient was also provided with oral hydration and food.  She was able to ambulate without complication.  The patient appears reasonably screened and/or stabilized for discharge and I doubt any other medical condition or  other [facility] requiring further screening, evaluation, or treatment in the ED at this time prior to discharge.  The patient is safe for discharge with strict return precautions.   Final Clinical Impression(s) / ED Diagnoses Final diagnoses:  SOB (shortness of breath)  Generalized weakness    Disposition: Discharge  Condition: Good  I have discussed the results, Dx and Tx plan with the patient who expressed understanding and agree(s) with the plan. Discharge instructions discussed at great length. The patient was given strict return precautions who verbalized understanding of the instructions. No further questions at time of discharge.    ED Discharge Orders    None       Follow Up: Velna Hatchet, [place] [phone]  Schedule an appointment as soon as possible for a visit  in 3-5 days, If symptoms do not improve or  worsen     This chart was dictated using voice recognition software.  Despite best efforts to proofread,  errors can occur which can change the documentation meaning.     Fatima Blank, MD 08/14/17 [phone]

## 2017-08-14 NOTE — ED Notes (Signed)
Bed: VH46 Expected date:  Expected time:  Means of arrival:  Comments: Ems 99

## 2017-08-14 NOTE — ED Notes (Signed)
CONTACT NUMBER FOR PATIENT IS [phone]  CONTACT NUMBER FOR PATIENTS NIECE Marguarite Arbour IS [phone]

## 2017-08-14 NOTE — ED Triage Notes (Signed)
Per EMS, patient comes from home. States she felt weak this morning, felt like her BP was low and took her BP multiple times and it okay- last one was 176/50. Pt wanted to come to hospital to get checked. VSS. Pt has edema in her feet that she states is worse today.

## 2017-12-08 ENCOUNTER — Ambulatory Visit (HOSPITAL_COMMUNITY): Payer: Medicare Other

## 2018-01-01 ENCOUNTER — Ambulatory Visit
Admission: RE | Admit: 2018-01-01 | Discharge: 2018-01-01 | Disposition: A | Discharge status: Home / Self Care | Payer: Medicare Other | Source: Ambulatory Visit | Attending: Cardiovascular Disease | Admitting: Cardiovascular Disease

## 2018-01-01 ENCOUNTER — Other Ambulatory Visit: Payer: Self-pay | Admitting: Cardiovascular Disease

## 2018-01-01 DIAGNOSIS — R0789 Other chest pain: Secondary | ICD-10-CM

## 2018-04-06 ENCOUNTER — Other Ambulatory Visit: Payer: Self-pay

## 2018-04-06 ENCOUNTER — Emergency Department (HOSPITAL_COMMUNITY)
Admission: EM | Admit: 2018-04-06 | Discharge: 2018-04-06 | Disposition: A | Discharge status: Home / Self Care | Payer: Medicare Other | Attending: Emergency Medicine | Admitting: Emergency Medicine

## 2018-04-06 DIAGNOSIS — I2510 Atherosclerotic heart disease of native coronary artery without angina pectoris: Secondary | ICD-10-CM | POA: Diagnosis not present

## 2018-04-06 DIAGNOSIS — Z7901 Long term (current) use of anticoagulants: Secondary | ICD-10-CM | POA: Insufficient documentation

## 2018-04-06 DIAGNOSIS — E039 Hypothyroidism, unspecified: Secondary | ICD-10-CM | POA: Diagnosis not present

## 2018-04-06 DIAGNOSIS — Z79899 Other long term (current) drug therapy: Secondary | ICD-10-CM | POA: Insufficient documentation

## 2018-04-06 DIAGNOSIS — I509 Heart failure, unspecified: Secondary | ICD-10-CM | POA: Insufficient documentation

## 2018-04-06 DIAGNOSIS — R609 Edema, unspecified: Secondary | ICD-10-CM | POA: Diagnosis not present

## 2018-04-06 DIAGNOSIS — I110 Hypertensive heart disease with heart failure: Secondary | ICD-10-CM | POA: Diagnosis not present

## 2018-04-06 DIAGNOSIS — R2243 Localized swelling, mass and lump, lower limb, bilateral: Secondary | ICD-10-CM | POA: Diagnosis present

## 2018-04-06 LAB — BASIC METABOLIC PANEL
ANION GAP: 9 (ref 5–15)
BUN: 50 mg/dL — ABNORMAL HIGH (ref 8–23)
CO2: 30 mmol/L (ref 22–32)
Calcium: 9.3 mg/dL (ref 8.9–10.3)
Chloride: 99 mmol/L (ref 98–111)
Creatinine, Ser: 1.11 mg/dL — ABNORMAL HIGH (ref 0.44–1.00)
GFR calc Af Amer: 46 mL/min — ABNORMAL LOW (ref >60)
GFR calc non Af Amer: 39 mL/min — ABNORMAL LOW (ref >60)
Glucose, Bld: 79 mg/dL (ref 70–99)
POTASSIUM: 4.6 mmol/L (ref 3.5–5.1)
Sodium: 138 mmol/L (ref 135–145)

## 2018-04-06 LAB — CBC WITH DIFFERENTIAL/PLATELET
BASOS ABS: 0 10*3/uL (ref 0.0–0.1)
Basophils Relative: 1 %
Eosinophils Absolute: 0.1 10*3/uL (ref 0.0–0.7)
Eosinophils Relative: 2 %
HEMATOCRIT: 34.2 % — AB (ref 36.0–46.0)
Hemoglobin: 11.4 g/dL — ABNORMAL LOW (ref 12.0–15.0)
LYMPHS ABS: 1.5 10*3/uL (ref 0.7–4.0)
LYMPHS PCT: 53 %
MCH: 31.1 pg (ref 26.0–34.0)
MCHC: 33.3 g/dL (ref 30.0–36.0)
MCV: 93.4 fL (ref 78.0–100.0)
MONO ABS: 0.2 10*3/uL (ref 0.1–1.0)
Monocytes Relative: 8 %
NEUTROS ABS: 1.1 10*3/uL — AB (ref 1.7–7.7)
Neutrophils Relative %: 36 %
Platelets: 217 10*3/uL (ref 150–400)
RBC: 3.66 MIL/uL — AB (ref 3.87–5.11)
RDW: 14 % (ref 11.5–15.5)
WBC: 2.9 10*3/uL — ABNORMAL LOW (ref 4.0–10.5)

## 2018-04-06 MED ORDER — ACETAMINOPHEN 500 MG PO TABS
1000.0000 mg | ORAL_TABLET | Freq: Once | ORAL | Status: AC
  Administered 2018-04-06: 1000 mg via ORAL
  Filled 2018-04-06: qty 2

## 2018-04-06 MED ORDER — FUROSEMIDE 40 MG PO TABS
80.0000 mg | ORAL_TABLET | Freq: Once | ORAL | Status: AC
  Administered 2018-04-06: 80 mg via ORAL
  Filled 2018-04-06: qty 2

## 2018-04-06 NOTE — ED Provider Notes (Signed)
[facility] DEPT Provider Note   CSN: [id number] Arrival date & time: 04/06/18  0121     History   Chief Complaint Chief Complaint  Patient presents with  . Leg Swelling    HPI [name] is a 84 y.o. female.  84 yo F with a cc of bilateral leg swelling.  This is been a chronic issue for her.  She has a history of heart failure and is on Lasix.  She has been taking it as prescribed.  Her swelling was improved last week but worsened this week.  She had some mild stiffness to the outside of her right calf that has improved.  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  She felt that she could feel the pulse in her head yesterday and that resolved.  She denies shortness of breath or chest pain.  Denies vomiting or diarrhea.  The history is provided by the patient.  Illness  This is a new problem. The current episode started yesterday. The problem occurs constantly. The problem has not changed since onset.Pertinent negatives include no chest pain, no headaches and no shortness of breath. Nothing aggravates the symptoms. Nothing relieves the symptoms. She has tried nothing for the symptoms. The treatment provided no relief.    Past Medical History:  Diagnosis Date  . Arthritis   . CHF (congestive heart failure) ([employer])   . Coronary artery disease   . Gout   . Hypertension   . Hypothyroidism   . Osteoporosis   . Shortness of breath   . Thyroid disease     Patient Active Problem List   Diagnosis Date Noted  . GI bleeding 06/21/2016  . Bilateral leg edema 06/21/2016  . Acute GI bleeding 06/21/2016  . Uncontrolled hypertension 03/09/2014    Past Surgical History:  Procedure Laterality Date  . ABDOMINAL HYSTERECTOMY    . APPENDECTOMY    . CARPAL TUNNEL RELEASE Right   . TONSILLECTOMY       OB History   None       Home Medications    Prior to Admission medications   Medication Sig Start Date End Date Taking? Authorizing Provider  apixaban (ELIQUIS) 2.5 MG TABS tablet Take 2.5 mg by mouth 2 (two) times daily.    [provider]  Cholecalciferol (VITAMIN D PO) Take 1 tablet by mouth daily as needed (Dietary supplement).     [provider]  denosumab (PROLIA) 60 MG/ML SOLN injection Inject 60 mg into the skin every 6 (six) months. Administer in upper arm, thigh, or abdomen    [provider]  famotidine (PEPCID) 20 MG tablet Take 1 tablet (20 mg total) by mouth 2 (two) times daily. Patient not taking: Reported on 08/14/2017 06/23/16   Dixie Dials, MD  furosemide (LASIX) 40 MG tablet Take 20 mg by mouth daily as needed for fluid or edema.  01/17/15   [provider]  levothyroxine (SYNTHROID, LEVOTHROID) 75 MCG tablet Take 75 mcg by mouth daily before breakfast.    [provider]  losartan (COZAAR) 50 MG tablet Take 50 mg by mouth daily.    [provider]  Polyethyl Glycol-Propyl Glycol (SYSTANE OP) Apply 1-2 drops to eye daily as needed (for eye dryness).    [provider]  potassium chloride (K-DUR,KLOR-CON) 10 MEQ tablet Take  10 mEq by mouth daily.     [provider]    Family History No family history on file.  Social History Social History   Tobacco Use  . Smoking status: Never Smoker  . Smokeless tobacco: Never Used  Substance Use Topics  . Alcohol use: No  . Drug use: No     Allergies   Crestor [rosuvastatin]   Review of Systems Review of Systems  Constitutional: Negative for chills and fever.  HENT: Negative for congestion and rhinorrhea.   Eyes: Negative for redness and visual disturbance.  Respiratory: Negative for shortness of breath and wheezing.   Cardiovascular: Positive for palpitations and leg swelling. Negative for chest pain.  Gastrointestinal: Negative for nausea and vomiting.   Genitourinary: Negative for dysuria and urgency.  Musculoskeletal: Negative for arthralgias and myalgias.  Skin: Negative for pallor and wound.  Neurological: Negative for dizziness and headaches.     Physical Exam Updated Vital Signs BP (!) 121/50   Pulse (!) 58   Temp 98 F (36.7 C) (Oral)   Resp 12   Ht 5\' 4"  (1.626 m)   Wt 52.2 kg (115 lb)   SpO2 99%   BMI 19.74 kg/m   Physical Exam  Constitutional: She is oriented to person, place, and time. She appears well-developed and well-nourished. No distress.  Cachectic  HENT:  Head: Normocephalic and atraumatic.  Eyes: Pupils are equal, round, and reactive to light. EOM are normal.  Neck: Normal range of motion. Neck supple.  Cardiovascular: Normal rate and regular rhythm. Exam reveals no gallop and no friction rub.  No murmur heard. Pulmonary/Chest: Effort normal. She has no wheezes. She has no rales.  Abdominal: Soft. She exhibits no distension and no mass. There is no tenderness. There is no guarding.  Musculoskeletal: She exhibits edema (1+ to the mid calf). She exhibits no tenderness.  Neurological: She is alert and oriented to person, place, and time.  Skin: Skin is warm and dry. She is not diaphoretic.  Psychiatric: She has a normal mood and affect. Her behavior is normal.  Nursing note and vitals reviewed.    ED Treatments / Results  Labs (all labs ordered are listed, but only abnormal results are displayed) Labs Reviewed  CBC WITH DIFFERENTIAL/PLATELET - Abnormal; Notable for the following components:      Result Value   WBC 2.9 (*)    RBC 3.66 (*)    Hemoglobin 11.4 (*)    HCT 34.2 (*)    Neutro Abs 1.1 (*)    All other components within normal limits  BASIC METABOLIC PANEL - Abnormal; Notable for the following components:   BUN 50 (*)    Creatinine, Ser 1.11 (*)    GFR calc non Af Amer 39 (*)    GFR calc Af Amer 46 (*)    All other components within normal limits    EKG EKG  Interpretation  Date/Time:  Monday April 06 2018 01:52:57 EDT Ventricular Rate:  64 PR Interval:    QRS Duration: 85 QT Interval:  398 QTC Calculation: 411 R Axis:   61 Text Interpretation:  Sinus rhythm Prolonged PR interval Baseline wander Otherwise no significant change Confirmed by Deno Etienne [phone]) on 04/06/2018 1:54:55 AM   Radiology No results found.  Procedures Procedures (including critical care time)  Medications Ordered in ED Medications  acetaminophen (TYLENOL) tablet 1,000 mg (1,000 mg Oral Given 04/06/18 0202)  furosemide (LASIX) tablet 80 mg (80 mg Oral Given 04/06/18 0317)  Initial Impression / Assessment and Plan / ED Course  I have reviewed the triage vital signs and the nursing notes.  Pertinent labs & imaging results that were available during my care of the patient were reviewed by me and considered in my medical decision making (see chart for details).     84 yO F with a chief complaint of worsening leg swelling, patient also woke up and felt that she had palpitations.  She denies any shortness of breath or chest pain or pressure with this.  She is clear lungs on my exam.  She is satting 100% on room air.  I will check electrolytes to evaluate for her potassium and renal function.  If this is unremarkable I will re-dose her Lasix here and have her follow-up with her PCP.  Labs with mild anemia, K 4.6.  Renal function at baseline.  Will give a double dose of home lasix.  PCP follow up.   4:50 AM:  I have discussed the diagnosis/risks/treatment options with the patient and believe the pt to be eligible for discharge home to follow-up with PCP. We also discussed returning to the ED immediately if new or worsening sx occur. We discussed the sx which are most concerning (e.g., sudden worsening pain, fever, inability to tolerate by mouth) that necessitate immediate return. Medications administered to the patient during their visit and any new prescriptions provided  to the patient are listed below.  Medications given during this visit Medications  acetaminophen (TYLENOL) tablet 1,000 mg (1,000 mg Oral Given 04/06/18 0202)  furosemide (LASIX) tablet 80 mg (80 mg Oral Given 04/06/18 0317)      The patient appears reasonably screen and/or stabilized for discharge and I doubt any other medical condition or other [facility] requiring further screening, evaluation, or treatment in the ED at this time prior to discharge.    Final Clinical Impressions(s) / ED Diagnoses   Final diagnoses:  Peripheral edema    ED Discharge Orders    None       Deno Etienne, DO 04/06/18 [id number]

## 2018-04-06 NOTE — ED Notes (Signed)
Pt ride is en route to this facility

## 2018-04-06 NOTE — ED Notes (Signed)
Pt waiting for ride to arrive.

## 2018-04-06 NOTE — Discharge Instructions (Signed)
Follow up with your PCP.  Return for shortness of breath

## 2018-04-06 NOTE — ED Triage Notes (Signed)
Pt c/o increased leg swelling over the last day. [t has a hx of CHF and is compliant with diuretics. Pt is A&O x4, coming from home, and ambulatory with cane

## 2018-07-18 ENCOUNTER — Encounter (HOSPITAL_COMMUNITY): Payer: Self-pay | Admitting: Emergency Medicine

## 2018-07-18 ENCOUNTER — Other Ambulatory Visit: Payer: Self-pay

## 2018-07-18 ENCOUNTER — Ambulatory Visit (HOSPITAL_COMMUNITY)
Admission: EM | Admit: 2018-07-18 | Discharge: 2018-07-18 | Disposition: A | Discharge status: Home / Self Care | Payer: Medicare Other | Attending: Family Medicine | Admitting: Family Medicine

## 2018-07-18 DIAGNOSIS — M542 Cervicalgia: Secondary | ICD-10-CM

## 2018-07-18 MED ORDER — PREDNISONE 10 MG PO TABS: 10.0000 mg | ORAL_TABLET | Freq: Every day | ORAL | 0 refills | Status: DC

## 2018-07-18 NOTE — ED Notes (Signed)
Patient had a phone number for a nephew named ralph.  Called number and told him that patient was ready to go

## 2018-07-18 NOTE — ED Provider Notes (Signed)
[facility]    CSN: [id number] Arrival date & time: 07/18/18  1608     History   Chief Complaint Chief Complaint  Patient presents with  . Generalized Body Aches    HPI [name] is a 84 y.o. female.   The patient presented to the [facility] with a complaint of generalized body pain as well as neck pain x 1 month. The patient denied any known injury. The patient stated that her PCP has told her to use Aspercreme and Voltaren gel.   Patient says that she is allergic to aspirin.  Patient is confused about her blood pressure medicine.  She has been prescribed pills that are different in color from ones that she is used to taking.     Past Medical History:  Diagnosis Date  . Arthritis   . CHF (congestive heart failure) ([place])   . Coronary artery disease   . Gout   . Hypertension   . Hypothyroidism   . Osteoporosis   . Shortness of breath   . Thyroid disease     Patient Active Problem List   Diagnosis Date Noted  . GI bleeding 06/21/2016  . Bilateral leg edema 06/21/2016  . Acute GI bleeding 06/21/2016  . Uncontrolled hypertension 03/09/2014    Past Surgical History:  Procedure Laterality Date  . ABDOMINAL HYSTERECTOMY    . APPENDECTOMY    . CARPAL TUNNEL RELEASE Right   . TONSILLECTOMY      OB History   None      Home Medications    Prior to Admission medications   Medication Sig Start Date End Date Taking? Authorizing Provider  apixaban (ELIQUIS) 2.5 MG TABS tablet Take 2.5 mg by mouth 2 (two) times daily.    [provider]  Cholecalciferol (VITAMIN D PO) Take 1 tablet by mouth daily as needed (Dietary supplement).     [provider]  denosumab (PROLIA) 60 MG/ML SOLN injection Inject 60 mg into the skin every 6 (six) months. Administer in upper arm, thigh, or abdomen    [provider]  famotidine (PEPCID) 20 MG tablet Take 1 tablet (20 mg total) by mouth 2 (two) times daily. Patient not taking: Reported on  08/14/2017 06/23/16   Dixie Dials, MD  furosemide (LASIX) 40 MG tablet Take 20 mg by mouth daily as needed for fluid or edema.  01/17/15   [provider]  levothyroxine (SYNTHROID, LEVOTHROID) 75 MCG tablet Take 75 mcg by mouth daily before breakfast.    [provider]  losartan (COZAAR) 50 MG tablet Take 50 mg by mouth daily.    [provider]  Polyethyl Glycol-Propyl Glycol (SYSTANE OP) Apply 1-2 drops to eye daily as needed (for eye dryness).    [provider]  potassium chloride (K-DUR,KLOR-CON) 10 MEQ tablet Take 10 mEq by mouth daily.     [provider]    Family History History reviewed. No pertinent family history.  Social History Social History   Tobacco Use  . Smoking status: Never Smoker  . Smokeless tobacco: Never Used  Substance Use Topics  . Alcohol use: No  . Drug use: No     Allergies   Crestor [rosuvastatin]   Review of Systems Review of Systems  HENT: Negative.   Respiratory: Negative.   Cardiovascular: Negative.   Genitourinary: Negative.   Musculoskeletal: Positive for neck pain and neck stiffness.  All other systems reviewed and are negative.    Physical Exam Triage Vital Signs  ED Triage Vitals [07/18/18 1655]  Enc Vitals Group     BP      Pulse Rate 73     Resp 18     Temp 99.3 F (37.4 C)     Temp Source Oral     SpO2 97 %     Weight      Height      Head Circumference      Peak Flow      Pain Score 8     Pain Loc      Pain Edu?      Excl. in [place]?    No data found.  Updated Vital Signs BP (!) 201/88 (BP Location: Right Arm) Comment: X 2  Pulse 73   Temp 99.3 F (37.4 C) (Oral)   Resp 18   SpO2 97%    Physical Exam  Constitutional: She appears well-developed and well-nourished.  HENT:  Right Ear: External ear normal.  Left Ear: External ear normal.  Mouth/Throat: Oropharynx is clear and moist.  Eyes: Conjunctivae are normal.  Neck:  Patient has discomfort when she turns  her head more than 30 degrees either way.  There is no localized tenderness.  There is no bruit in her neck.  Cardiovascular: Normal rate, regular rhythm and normal heart sounds.  Pulmonary/Chest: Effort normal and breath sounds normal.  Musculoskeletal: She exhibits no tenderness or deformity.  Neurological: She is alert.  Skin: Skin is warm and dry.  Nursing note and vitals reviewed.    UC Treatments / Results  Labs (all labs ordered are listed, but only abnormal results are displayed) Labs Reviewed - No data to display  EKG None  Radiology No results found.  Procedures Procedures (including critical care time)  Medications Ordered in UC Medications - No data to display  Initial Impression / Assessment and Plan / UC Course  I have reviewed the triage vital signs and the nursing notes.  Pertinent labs & imaging results that were available during my care of the patient were reviewed by me and considered in my medical decision making (see chart for details).    Final Clinical Impressions(s) / UC Diagnoses   Final diagnoses:  None   Discharge Instructions   None    ED Prescriptions    None     Controlled Substance Prescriptions [place] Controlled Substance Registry consulted? Not Applicable   Robyn Haber, MD 07/18/18 [phone]

## 2018-07-18 NOTE — Discharge Instructions (Signed)
You need to check with your personal care provider about your blood pressure medicine and potassium when they get back in their office on Monday.  In the meantime I want you to take 1 tablet of the anti-inflammatory medicine called prednisone, a type of cortisone pill, daily.  I expect that he will start having less neck pain in the next 2 days.

## 2018-07-18 NOTE — ED Triage Notes (Signed)
The patient presented to the [facility] with a complaint of generalized body pain as well as neck pain x 1 month. The patient denied any known injury. The patient stated that her PCP has told her to use Aspercreme and Voltaren gel.

## 2018-11-11 ENCOUNTER — Emergency Department (HOSPITAL_COMMUNITY)
Admission: EM | Admit: 2018-11-11 | Discharge: 2018-11-11 | Disposition: A | Discharge status: Home / Self Care | Payer: Medicare Other | Attending: Emergency Medicine | Admitting: Emergency Medicine

## 2018-11-11 ENCOUNTER — Encounter (HOSPITAL_COMMUNITY): Payer: Self-pay

## 2018-11-11 ENCOUNTER — Emergency Department (HOSPITAL_COMMUNITY): Payer: Medicare Other

## 2018-11-11 ENCOUNTER — Other Ambulatory Visit: Payer: Self-pay

## 2018-11-11 DIAGNOSIS — Z79899 Other long term (current) drug therapy: Secondary | ICD-10-CM | POA: Diagnosis not present

## 2018-11-11 DIAGNOSIS — I2510 Atherosclerotic heart disease of native coronary artery without angina pectoris: Secondary | ICD-10-CM | POA: Diagnosis not present

## 2018-11-11 DIAGNOSIS — E039 Hypothyroidism, unspecified: Secondary | ICD-10-CM | POA: Diagnosis not present

## 2018-11-11 DIAGNOSIS — Z7901 Long term (current) use of anticoagulants: Secondary | ICD-10-CM | POA: Diagnosis not present

## 2018-11-11 DIAGNOSIS — M542 Cervicalgia: Secondary | ICD-10-CM | POA: Diagnosis not present

## 2018-11-11 DIAGNOSIS — I509 Heart failure, unspecified: Secondary | ICD-10-CM | POA: Diagnosis not present

## 2018-11-11 DIAGNOSIS — I110 Hypertensive heart disease with heart failure: Secondary | ICD-10-CM | POA: Insufficient documentation

## 2018-11-11 MED ORDER — ACETAMINOPHEN 500 MG PO TABS
500.0000 mg | ORAL_TABLET | Freq: Once | ORAL | Status: AC
  Administered 2018-11-11: 500 mg via ORAL
  Filled 2018-11-11: qty 1

## 2018-11-11 NOTE — Discharge Instructions (Signed)
Please read and follow all provided instructions.  Your diagnoses today include:  1. Neck pain     Tests performed today include:  Vital signs - see below for your results today  X-ray of your neck -- shows signs of arthritis and degenerative disc disease  Medications prescribed:   None  Take any prescribed medications only as directed.  Home care instructions:   Follow any educational materials contained in this packet  Please use Tylenol, heat, massage on the area for pain control.  Follow-up instructions: Please follow-up with your primary care provider in the next 1 week for further evaluation of your symptoms.   Return instructions:  SEEK IMMEDIATE MEDICAL ATTENTION IF YOU HAVE:  New numbness, tingling, weakness, or problem with the use of your arms or legs  Severe back pain not relieved with medications  Loss control of your bowels or bladder  Increasing pain in any areas of the body (such as chest or abdominal pain)  Shortness of breath, dizziness, or fainting.   Worsening nausea (feeling sick to your stomach), vomiting, fever, or sweats  Any other emergent concerns regarding your health   Additional Information:  Your vital signs today were: BP (!) 160/68 (BP Location: Right Arm)    Pulse 69    Resp 15    SpO2 100%  If your blood pressure (BP) was elevated above 135/85 this visit, please have this repeated by your doctor within one month. --------------

## 2018-11-11 NOTE — ED Triage Notes (Signed)
Per EMS, patient coming from home with complaints of neck pain and temporal pain that has been occurring over the past 5 months. She has been seen and treated for this issue, but nothing seems to make it stop completely.Patient has tried different creams and a muscle relaxer. She will have periods where it does not bother her, but the pain started back about a day ago. Patient has not fallen or had any trauma to the head/neck.

## 2018-11-11 NOTE — ED Notes (Signed)
Bed: WA09 Expected date:  Expected time:  Means of arrival:  Comments: 85 yr old neck pain headache

## 2018-11-11 NOTE — ED Notes (Signed)
Patient transported to x-ray. ?

## 2018-11-11 NOTE — ED Provider Notes (Signed)
[facility] DEPT Provider Note   CSN: [id number] Arrival date & time: 11/11/18  0557     History   Chief Complaint Chief Complaint  Patient presents with  . Neck Pain    HPI [name] is a 85 y.o. female.  Patient with h/o anticoagulation, CHF on Lasix -- presents with c/o neck pain that is chronic in nature, ongoing since last September/October. Pain is in bilateral neck. It radiates to her head and causes an aching HA. Pain is waxing and waning. She states she has complained to her doctors about this and they have instructed her to use topical medications such as aspercreme. She has also taken oral Tylenol and applied heat. These treatments have not helped to an extent that makes her feel better. No upper extremity weakness, numbness, tingling. Denies trauma. Pain is worse over the past week. Made worse with movement and palpation.      Past Medical History:  Diagnosis Date  . Arthritis   . CHF (congestive heart failure) ([facility])   . Coronary artery disease   . Gout   . Hypertension   . Hypothyroidism   . Osteoporosis   . Shortness of breath   . Thyroid disease     Patient Active Problem List   Diagnosis Date Noted  . GI bleeding 06/21/2016  . Bilateral leg edema 06/21/2016  . Acute GI bleeding 06/21/2016  . Uncontrolled hypertension 03/09/2014    Past Surgical History:  Procedure Laterality Date  . ABDOMINAL HYSTERECTOMY    . APPENDECTOMY    . CARPAL TUNNEL RELEASE Right   . TONSILLECTOMY       OB History   No obstetric history on file.      Home Medications    Prior to Admission medications   Medication Sig Start Date End Date Taking? Authorizing Provider  apixaban (ELIQUIS) 2.5 MG TABS tablet Take 2.5 mg by mouth 2 (two) times daily.    [provider]  Cholecalciferol (VITAMIN D PO) Take 1 tablet by mouth daily as needed (Dietary supplement).     [provider]  denosumab (PROLIA) 60 MG/ML  SOLN injection Inject 60 mg into the skin every 6 (six) months. Administer in upper arm, thigh, or abdomen    [provider]  furosemide (LASIX) 40 MG tablet Take 20 mg by mouth daily as needed for fluid or edema.  01/17/15   [provider]  levothyroxine (SYNTHROID, LEVOTHROID) 75 MCG tablet Take 75 mcg by mouth daily before breakfast.    [provider]  losartan (COZAAR) 50 MG tablet Take 50 mg by mouth daily.    [provider]  Polyethyl Glycol-Propyl Glycol (SYSTANE OP) Apply 1-2 drops to eye daily as needed (for eye dryness).    [provider]  potassium chloride (K-DUR,KLOR-CON) 10 MEQ tablet Take 10 mEq by mouth daily.     [provider]  predniSONE (DELTASONE) 10 MG tablet Take 1 tablet (10 mg total) by mouth daily with breakfast. 07/18/18   Robyn Haber, MD    Family History History reviewed. No pertinent family history.  Social History Social History   Tobacco Use  . Smoking status: Never Smoker  . Smokeless tobacco: Never Used  Substance Use Topics  . Alcohol use: No  . Drug use: No     Allergies   Crestor [rosuvastatin]   Review of Systems Review of Systems  Constitutional: Negative for fever.  HENT: Negative for rhinorrhea and sore throat.  Eyes: Negative for redness.  Respiratory: Negative for cough.   Cardiovascular: Negative for chest pain.  Gastrointestinal: Negative for nausea and vomiting.  Genitourinary: Negative for dysuria.  Musculoskeletal: Positive for myalgias and neck pain.  Skin: Negative for rash.  Neurological: Positive for headaches. Negative for weakness and numbness.     Physical Exam Updated Vital Signs BP (!) 160/68 (BP Location: Right Arm)   Pulse 69   Resp 15   SpO2 100%   Physical Exam Vitals signs and nursing note reviewed.  Constitutional:      Appearance: She is well-developed.  HENT:     Head: Normocephalic and atraumatic.  Eyes:     General:        Right  eye: No discharge.        Left eye: No discharge.     Conjunctiva/sclera: Conjunctivae normal.  Neck:     Musculoskeletal: Normal range of motion and neck supple. Normal range of motion. Pain with movement present. No edema, erythema or neck rigidity.     Vascular: No carotid bruit.   Cardiovascular:     Rate and Rhythm: Normal rate and regular rhythm.     Heart sounds: Murmur present.     Comments: II/VI systolic murmur LSB Pulmonary:     Effort: Pulmonary effort is normal.     Breath sounds: Normal breath sounds.  Abdominal:     Palpations: Abdomen is soft.     Tenderness: There is no abdominal tenderness.  Musculoskeletal:     Right shoulder: She exhibits tenderness (trapezius). She exhibits normal range of motion and no bony tenderness.     Left shoulder: She exhibits tenderness (trapezius). She exhibits normal range of motion and no bony tenderness.     Cervical back: She exhibits tenderness. She exhibits normal range of motion and no bony tenderness.     Thoracic back: She exhibits normal range of motion, no tenderness and no bony tenderness.  Skin:    General: Skin is warm and dry.  Neurological:     Mental Status: She is alert.      ED Treatments / Results  Labs (all labs ordered are listed, but only abnormal results are displayed) Labs Reviewed - No data to display  EKG None  Radiology Dg Cervical Spine Complete  Result Date: 11/11/2018 CLINICAL DATA:  Chronic neck pain EXAM: CERVICAL SPINE - COMPLETE 4+ VIEW COMPARISON:  Cervical MRI December 22, 2004 FINDINGS: Frontal, lateral, open-mouth odontoid, and bilateral oblique views were obtained. There is again noted exaggerated cervical lordosis. There is no appreciable fracture or spondylolisthesis. Prevertebral soft tissues and predental space regions are normal. There is marked disc space narrowing at C6-7. There is moderately severe disc space narrowing at C3-4 and C7-T1. There is milder disc space narrowing at C4-5.  There are prominent anterior osteophytes at C5 and C6. There is facet hypertrophy at all levels except for C2-3 bilaterally. Bones osteoporotic. Lung apices are clear. IMPRESSION: Multilevel arthropathy with disc space narrowing most severe at C6-7. Exaggerated cervical lordosis. No fracture or spondylolisthesis. Bones osteoporotic. Electronically Signed   By: Lowella Grip III M.D.   On: 11/11/2018 07:14    Procedures Procedures (including critical care time)  Medications Ordered in ED Medications  acetaminophen (TYLENOL) tablet 500 mg (500 mg Oral Given 11/11/18 0708)     Initial Impression / Assessment and Plan / ED Course  I have reviewed the triage vital signs and the nursing notes.  Pertinent labs & imaging results that were  available during my care of the patient were reviewed by me and considered in my medical decision making (see chart for details).     Patient seen and examined. Work-up initiated. Medications ordered. Patient is very sharp for her age and gives a complete history. She moves extremities well. She is moving neck with some discomfort but is in no distress. She has not had imaging per Epic and her report so will proceed with cervical spine x-ray.   Vital signs reviewed and are as follows: BP (!) 160/68 (BP Location: Right Arm)   Pulse 69   Resp 15   SpO2 100%   8:22 AM Patient discussed with and seen by Dr. Leonette Monarch. Pt reportedly feels better after application of heat. Pt to continue use of heat, massage, and Tylenol to treat symptoms.  Encourage PCP follow-up in the next 1 week.  Return with any worsening symptoms or other concerns.  Final Clinical Impressions(s) / ED Diagnoses   Final diagnoses:  Neck pain   Patient with musculoskeletal neck pain, worse with movement and palpation.  Intact neuro exam here today.  Patient has had this pain waxing and waning over the past several months.  X-ray today shows signs of arthritis and degenerative disc disease.   No compression fractures.  Feel that supportive care is indicated at this time with PCP follow-up.  Patient agreeable.  Discharged home.  ED Discharge Orders    None       Carlisle Cater, Hershal Coria 11/11/18 5520    Fatima Blank, MD 11/11/18 [phone]

## 2018-12-10 ENCOUNTER — Other Ambulatory Visit: Payer: Self-pay | Admitting: Cardiovascular Disease

## 2018-12-10 ENCOUNTER — Ambulatory Visit
Admission: RE | Admit: 2018-12-10 | Discharge: 2018-12-10 | Disposition: A | Discharge status: Home / Self Care | Payer: Medicare Other | Source: Ambulatory Visit | Attending: Cardiovascular Disease | Admitting: Cardiovascular Disease

## 2018-12-10 ENCOUNTER — Other Ambulatory Visit: Payer: Self-pay

## 2018-12-10 DIAGNOSIS — M25561 Pain in right knee: Principal | ICD-10-CM

## 2018-12-10 DIAGNOSIS — M25461 Effusion, right knee: Secondary | ICD-10-CM

## 2019-11-24 ENCOUNTER — Ambulatory Visit (INDEPENDENT_AMBULATORY_CARE_PROVIDER_SITE_OTHER): Payer: Medicare PPO

## 2019-11-24 ENCOUNTER — Other Ambulatory Visit: Payer: Self-pay

## 2019-11-24 ENCOUNTER — Ambulatory Visit (INDEPENDENT_AMBULATORY_CARE_PROVIDER_SITE_OTHER): Payer: Medicare PPO | Admitting: Orthopedic Surgery

## 2019-11-24 ENCOUNTER — Ambulatory Visit: Payer: Self-pay

## 2019-11-24 DIAGNOSIS — M25562 Pain in left knee: Secondary | ICD-10-CM | POA: Diagnosis not present

## 2019-11-24 DIAGNOSIS — M25561 Pain in right knee: Secondary | ICD-10-CM

## 2019-11-24 DIAGNOSIS — G8929 Other chronic pain: Secondary | ICD-10-CM

## 2019-11-27 ENCOUNTER — Encounter: Payer: Self-pay | Admitting: Orthopedic Surgery

## 2019-11-27 NOTE — Progress Notes (Signed)
Office Visit Note   Patient: [name]           Date of Birth: 20-Mar-1916           MRN: [id number] Visit Date: 11/24/2019 Requested by: Ashlee Hatchet, MD [address] [place],  [place] [address] PCP: Ashlee Hatchet, MD  Subjective: Chief Complaint  Patient presents with  . Right Knee - Pain  . Left Knee - Pain    HPI: [name] is a patient with right worse than left knee pain.  She states that when she transfers and tries ambulate is very painful.  No prior knee surgery.  Takes Tylenol PM without much relief.  She states that she does not walk much.  She has also tried topical treatments.  She is interested in potential brace treatment for her longstanding knee pain and valgus deformity.              ROS: All systems reviewed are negative as they relate to the chief complaint within the history of present illness.  Patient denies  fevers or chills.   Assessment & Plan: Visit Diagnoses:  1. Chronic pain of both knees     Plan: Impression is end-stage tricompartmental arthritis worse on the right compared to the left.  Her valgus deformity is fixed.  Surprisingly she can extend the knee and has reasonable flexion but the deformity is completely uncorrectable.  Bracing will not help the situation.  We discussed injections for temporary relief but she does not want to try that either.  I would favor observation for now.  Follow-up as needed.  Follow-Up Instructions: Return if symptoms worsen or fail to improve.   Orders:  Orders Placed This Encounter  Procedures  . XR Knee 1-2 Views Right  . XR KNEE 3 VIEW LEFT   No orders of the defined types were placed in this encounter.     Procedures: No procedures performed   Clinical Data: No additional findings.  Objective: Vital Signs: There were no vitals taken for this visit.  Physical Exam:   Constitutional: Patient appears well-developed HEENT:  Head: Normocephalic Eyes:EOM are normal Neck: Normal range  of motion Cardiovascular: Normal rate Pulmonary/chest: Effort normal Neurologic: Patient is alert Skin: Skin is warm Psychiatric: Patient has normal mood and affect    Ortho Exam: Ortho exam demonstrates full active and passive range of motion of the hips without any groin pain.  She has valgus deformity on the right-hand side but can achieve almost full extension on the right and flexion to about 80 degrees.  On the left-hand side the extensor mechanism is intact and she has pretty reasonable range of motion there.  Pedal pulses palpable but she does have 3+ pitting edema in bilateral lower extremities.  No effusion in either knee.  Compartments are otherwise soft.  Specialty Comments:  No specialty comments available.  Imaging: No results found.   PMFS History: Patient Active Problem List   Diagnosis Date Noted  . GI bleeding 06/21/2016  . Bilateral leg edema 06/21/2016  . Acute GI bleeding 06/21/2016  . Uncontrolled hypertension 03/09/2014   Past Medical History:  Diagnosis Date  . Arthritis   . CHF (congestive heart failure) ([facility])   . Coronary artery disease   . Gout   . Hypertension   . Hypothyroidism   . Osteoporosis   . Shortness of breath   . Thyroid disease     History reviewed. No pertinent family history.  Past Surgical History:  Procedure  Laterality Date  . ABDOMINAL HYSTERECTOMY    . APPENDECTOMY    . CARPAL TUNNEL RELEASE Right   . TONSILLECTOMY     Social History   Occupational History  . Not on file  Tobacco Use  . Smoking status: Never Smoker  . Smokeless tobacco: Never Used  Substance and Sexual Activity  . Alcohol use: No  . Drug use: No  . Sexual activity: Never

## 2020-03-02 DIAGNOSIS — M25561 Pain in right knee: Secondary | ICD-10-CM | POA: Diagnosis not present

## 2020-03-02 DIAGNOSIS — E034 Atrophy of thyroid (acquired): Secondary | ICD-10-CM | POA: Diagnosis not present

## 2020-03-02 DIAGNOSIS — M542 Cervicalgia: Secondary | ICD-10-CM | POA: Diagnosis not present

## 2020-03-02 DIAGNOSIS — M25562 Pain in left knee: Secondary | ICD-10-CM | POA: Diagnosis not present

## 2020-03-02 DIAGNOSIS — R072 Precordial pain: Secondary | ICD-10-CM | POA: Diagnosis not present

## 2020-04-27 DIAGNOSIS — L400 Psoriasis vulgaris: Secondary | ICD-10-CM | POA: Diagnosis not present

## 2020-06-01 DIAGNOSIS — M25561 Pain in right knee: Secondary | ICD-10-CM | POA: Diagnosis not present

## 2020-06-01 DIAGNOSIS — E034 Atrophy of thyroid (acquired): Secondary | ICD-10-CM | POA: Diagnosis not present

## 2020-06-01 DIAGNOSIS — R072 Precordial pain: Secondary | ICD-10-CM | POA: Diagnosis not present

## 2020-06-01 DIAGNOSIS — M542 Cervicalgia: Secondary | ICD-10-CM | POA: Diagnosis not present

## 2020-06-01 DIAGNOSIS — M25562 Pain in left knee: Secondary | ICD-10-CM | POA: Diagnosis not present

## 2020-06-14 DIAGNOSIS — Z7189 Other specified counseling: Secondary | ICD-10-CM | POA: Diagnosis not present

## 2020-06-14 DIAGNOSIS — R413 Other amnesia: Secondary | ICD-10-CM | POA: Diagnosis not present

## 2020-06-14 DIAGNOSIS — I130 Hypertensive heart and chronic kidney disease with heart failure and stage 1 through stage 4 chronic kidney disease, or unspecified chronic kidney disease: Secondary | ICD-10-CM | POA: Diagnosis not present

## 2020-06-14 DIAGNOSIS — R269 Unspecified abnormalities of gait and mobility: Secondary | ICD-10-CM | POA: Diagnosis not present

## 2020-06-14 DIAGNOSIS — R54 Age-related physical debility: Secondary | ICD-10-CM | POA: Diagnosis not present

## 2020-06-14 DIAGNOSIS — E785 Hyperlipidemia, unspecified: Secondary | ICD-10-CM | POA: Diagnosis not present

## 2020-06-14 DIAGNOSIS — E039 Hypothyroidism, unspecified: Secondary | ICD-10-CM | POA: Diagnosis not present

## 2020-07-13 DIAGNOSIS — I5022 Chronic systolic (congestive) heart failure: Secondary | ICD-10-CM | POA: Diagnosis not present

## 2020-07-13 DIAGNOSIS — M542 Cervicalgia: Secondary | ICD-10-CM | POA: Diagnosis not present

## 2020-07-13 DIAGNOSIS — M25562 Pain in left knee: Secondary | ICD-10-CM | POA: Diagnosis not present

## 2020-07-13 DIAGNOSIS — E034 Atrophy of thyroid (acquired): Secondary | ICD-10-CM | POA: Diagnosis not present

## 2020-07-13 DIAGNOSIS — M25561 Pain in right knee: Secondary | ICD-10-CM | POA: Diagnosis not present

## 2020-07-20 DIAGNOSIS — E034 Atrophy of thyroid (acquired): Secondary | ICD-10-CM | POA: Diagnosis not present

## 2020-07-20 DIAGNOSIS — M25562 Pain in left knee: Secondary | ICD-10-CM | POA: Diagnosis not present

## 2020-07-20 DIAGNOSIS — I5022 Chronic systolic (congestive) heart failure: Secondary | ICD-10-CM | POA: Diagnosis not present

## 2020-07-20 DIAGNOSIS — M25561 Pain in right knee: Secondary | ICD-10-CM | POA: Diagnosis not present

## 2020-07-21 DIAGNOSIS — E785 Hyperlipidemia, unspecified: Secondary | ICD-10-CM | POA: Diagnosis not present

## 2020-07-21 DIAGNOSIS — M810 Age-related osteoporosis without current pathological fracture: Secondary | ICD-10-CM | POA: Diagnosis not present

## 2020-07-21 DIAGNOSIS — E039 Hypothyroidism, unspecified: Secondary | ICD-10-CM | POA: Diagnosis not present

## 2020-07-28 DIAGNOSIS — R82998 Other abnormal findings in urine: Secondary | ICD-10-CM | POA: Diagnosis not present

## 2020-07-28 DIAGNOSIS — E43 Unspecified severe protein-calorie malnutrition: Secondary | ICD-10-CM | POA: Diagnosis not present

## 2020-07-28 DIAGNOSIS — E039 Hypothyroidism, unspecified: Secondary | ICD-10-CM | POA: Diagnosis not present

## 2020-07-28 DIAGNOSIS — E785 Hyperlipidemia, unspecified: Secondary | ICD-10-CM | POA: Diagnosis not present

## 2020-07-28 DIAGNOSIS — Z0000 Encounter for general adult medical examination without abnormal findings: Secondary | ICD-10-CM | POA: Diagnosis not present

## 2020-07-28 DIAGNOSIS — I10 Essential (primary) hypertension: Secondary | ICD-10-CM | POA: Diagnosis not present

## 2020-07-28 DIAGNOSIS — Z23 Encounter for immunization: Secondary | ICD-10-CM | POA: Diagnosis not present

## 2020-08-23 DIAGNOSIS — I5022 Chronic systolic (congestive) heart failure: Secondary | ICD-10-CM | POA: Diagnosis not present

## 2020-08-23 DIAGNOSIS — M25561 Pain in right knee: Secondary | ICD-10-CM | POA: Diagnosis not present

## 2020-08-23 DIAGNOSIS — E034 Atrophy of thyroid (acquired): Secondary | ICD-10-CM | POA: Diagnosis not present

## 2020-08-23 DIAGNOSIS — M25562 Pain in left knee: Secondary | ICD-10-CM | POA: Diagnosis not present

## 2020-08-31 DIAGNOSIS — E034 Atrophy of thyroid (acquired): Secondary | ICD-10-CM | POA: Diagnosis not present

## 2020-08-31 DIAGNOSIS — M25561 Pain in right knee: Secondary | ICD-10-CM | POA: Diagnosis not present

## 2020-08-31 DIAGNOSIS — R051 Acute cough: Secondary | ICD-10-CM | POA: Diagnosis not present

## 2020-08-31 DIAGNOSIS — I5022 Chronic systolic (congestive) heart failure: Secondary | ICD-10-CM | POA: Diagnosis not present

## 2020-10-31 DIAGNOSIS — M25562 Pain in left knee: Secondary | ICD-10-CM | POA: Diagnosis not present

## 2020-10-31 DIAGNOSIS — E034 Atrophy of thyroid (acquired): Secondary | ICD-10-CM | POA: Diagnosis not present

## 2020-10-31 DIAGNOSIS — R072 Precordial pain: Secondary | ICD-10-CM | POA: Diagnosis not present

## 2020-10-31 DIAGNOSIS — M25561 Pain in right knee: Secondary | ICD-10-CM | POA: Diagnosis not present

## 2021-01-16 DIAGNOSIS — R531 Weakness: Secondary | ICD-10-CM | POA: Diagnosis not present

## 2021-01-16 DIAGNOSIS — I5022 Chronic systolic (congestive) heart failure: Secondary | ICD-10-CM | POA: Diagnosis not present

## 2021-01-16 DIAGNOSIS — I350 Nonrheumatic aortic (valve) stenosis: Secondary | ICD-10-CM | POA: Diagnosis not present

## 2021-01-16 DIAGNOSIS — R072 Precordial pain: Secondary | ICD-10-CM | POA: Diagnosis not present

## 2021-01-16 DIAGNOSIS — R0602 Shortness of breath: Secondary | ICD-10-CM | POA: Diagnosis not present

## 2021-01-22 DIAGNOSIS — E876 Hypokalemia: Secondary | ICD-10-CM | POA: Diagnosis not present

## 2021-01-22 DIAGNOSIS — D649 Anemia, unspecified: Secondary | ICD-10-CM | POA: Diagnosis not present

## 2021-01-22 DIAGNOSIS — E7849 Other hyperlipidemia: Secondary | ICD-10-CM | POA: Diagnosis not present

## 2021-01-22 DIAGNOSIS — E034 Atrophy of thyroid (acquired): Secondary | ICD-10-CM | POA: Diagnosis not present

## 2021-01-22 DIAGNOSIS — Z79899 Other long term (current) drug therapy: Secondary | ICD-10-CM | POA: Diagnosis not present

## 2021-01-31 DIAGNOSIS — E43 Unspecified severe protein-calorie malnutrition: Secondary | ICD-10-CM | POA: Diagnosis not present

## 2021-01-31 DIAGNOSIS — E785 Hyperlipidemia, unspecified: Secondary | ICD-10-CM | POA: Diagnosis not present

## 2021-01-31 DIAGNOSIS — E039 Hypothyroidism, unspecified: Secondary | ICD-10-CM | POA: Diagnosis not present

## 2021-01-31 DIAGNOSIS — I10 Essential (primary) hypertension: Secondary | ICD-10-CM | POA: Diagnosis not present

## 2021-02-14 DIAGNOSIS — R0602 Shortness of breath: Secondary | ICD-10-CM | POA: Diagnosis not present

## 2021-02-14 DIAGNOSIS — I5022 Chronic systolic (congestive) heart failure: Secondary | ICD-10-CM | POA: Diagnosis not present

## 2021-02-14 DIAGNOSIS — E034 Atrophy of thyroid (acquired): Secondary | ICD-10-CM | POA: Diagnosis not present

## 2021-02-14 DIAGNOSIS — R072 Precordial pain: Secondary | ICD-10-CM | POA: Diagnosis not present

## 2021-05-05 DIAGNOSIS — R4589 Other symptoms and signs involving emotional state: Secondary | ICD-10-CM | POA: Diagnosis not present

## 2021-05-05 DIAGNOSIS — B9689 Other specified bacterial agents as the cause of diseases classified elsewhere: Secondary | ICD-10-CM | POA: Diagnosis not present

## 2021-05-05 DIAGNOSIS — R531 Weakness: Secondary | ICD-10-CM | POA: Diagnosis not present

## 2021-05-05 DIAGNOSIS — R410 Disorientation, unspecified: Secondary | ICD-10-CM | POA: Diagnosis not present

## 2021-05-05 DIAGNOSIS — L89151 Pressure ulcer of sacral region, stage 1: Secondary | ICD-10-CM | POA: Diagnosis not present

## 2021-05-05 DIAGNOSIS — Z681 Body mass index (BMI) 19 or less, adult: Secondary | ICD-10-CM | POA: Diagnosis not present

## 2021-05-05 DIAGNOSIS — Z743 Need for continuous supervision: Secondary | ICD-10-CM | POA: Diagnosis not present

## 2021-05-05 DIAGNOSIS — E43 Unspecified severe protein-calorie malnutrition: Secondary | ICD-10-CM | POA: Diagnosis not present

## 2021-05-05 DIAGNOSIS — R64 Cachexia: Secondary | ICD-10-CM | POA: Diagnosis not present

## 2021-05-05 DIAGNOSIS — R1310 Dysphagia, unspecified: Secondary | ICD-10-CM | POA: Diagnosis not present

## 2021-05-05 DIAGNOSIS — Z20822 Contact with and (suspected) exposure to covid-19: Secondary | ICD-10-CM | POA: Diagnosis not present

## 2021-05-05 DIAGNOSIS — F0390 Unspecified dementia without behavioral disturbance: Secondary | ICD-10-CM | POA: Diagnosis not present

## 2021-05-05 DIAGNOSIS — E039 Hypothyroidism, unspecified: Secondary | ICD-10-CM | POA: Diagnosis not present

## 2021-05-05 DIAGNOSIS — I10 Essential (primary) hypertension: Secondary | ICD-10-CM | POA: Diagnosis not present

## 2021-05-05 DIAGNOSIS — L89101 Pressure ulcer of unspecified part of back, stage 1: Secondary | ICD-10-CM | POA: Diagnosis not present

## 2021-05-05 DIAGNOSIS — Z515 Encounter for palliative care: Secondary | ICD-10-CM | POA: Diagnosis not present

## 2021-05-05 DIAGNOSIS — N390 Urinary tract infection, site not specified: Secondary | ICD-10-CM | POA: Diagnosis not present

## 2021-05-31 DEATH — deceased

## 2023-11-04 NOTE — Progress Notes (Signed)
Nail trim
